# Patient Record
Sex: Male | Born: 1955 | Race: Black or African American | Hispanic: No | Smoking: Current some day smoker
Health system: Southern US, Community
[De-identification: ages and names within clinical notes are randomized; demographics above are authoritative.]

## PROBLEM LIST (undated history)

## (undated) DIAGNOSIS — T884XXA Failed or difficult intubation, initial encounter: Secondary | ICD-10-CM

## (undated) DIAGNOSIS — I10 Essential (primary) hypertension: Secondary | ICD-10-CM

## (undated) DIAGNOSIS — Z923 Personal history of irradiation: Secondary | ICD-10-CM

## (undated) DIAGNOSIS — E079 Disorder of thyroid, unspecified: Secondary | ICD-10-CM

## (undated) DIAGNOSIS — C321 Malignant neoplasm of supraglottis: Secondary | ICD-10-CM

## (undated) DIAGNOSIS — J069 Acute upper respiratory infection, unspecified: Secondary | ICD-10-CM

## (undated) DIAGNOSIS — K219 Gastro-esophageal reflux disease without esophagitis: Secondary | ICD-10-CM

## (undated) DIAGNOSIS — E785 Hyperlipidemia, unspecified: Secondary | ICD-10-CM

## (undated) DIAGNOSIS — D649 Anemia, unspecified: Secondary | ICD-10-CM

## (undated) HISTORY — PX: GASTROSTOMY W/ FEEDING TUBE: SUR642

## (undated) HISTORY — PX: MULTIPLE TOOTH EXTRACTIONS: SHX2053

## (undated) HISTORY — DX: Hyperlipidemia, unspecified: E78.5

## (undated) HISTORY — DX: Disorder of thyroid, unspecified: E07.9

## (undated) HISTORY — PX: LEG SURGERY: SHX1003

## (undated) HISTORY — DX: Anemia, unspecified: D64.9

## (undated) HISTORY — DX: Failed or difficult intubation, initial encounter: T88.4XXA

---
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
## 1998-06-30 ENCOUNTER — Emergency Department (HOSPITAL_COMMUNITY): Admission: EM | Admit: 1998-06-30 | Discharge: 1998-06-30 | Payer: Self-pay | Admitting: Internal Medicine

## 1998-08-03 ENCOUNTER — Emergency Department (HOSPITAL_COMMUNITY): Admission: EM | Admit: 1998-08-03 | Discharge: 1998-08-04 | Payer: Self-pay | Admitting: Emergency Medicine

## 1998-08-10 ENCOUNTER — Emergency Department (HOSPITAL_COMMUNITY): Admission: EM | Admit: 1998-08-10 | Discharge: 1998-08-10 | Payer: Self-pay | Admitting: Emergency Medicine

## 1998-08-19 ENCOUNTER — Emergency Department (HOSPITAL_COMMUNITY): Admission: EM | Admit: 1998-08-19 | Discharge: 1998-08-19 | Payer: Self-pay | Admitting: Emergency Medicine

## 2000-04-10 ENCOUNTER — Encounter: Payer: Self-pay | Admitting: Emergency Medicine

## 2000-04-10 ENCOUNTER — Emergency Department (HOSPITAL_COMMUNITY): Admission: EM | Admit: 2000-04-10 | Discharge: 2000-04-10 | Payer: Self-pay | Admitting: Emergency Medicine

## 2001-05-13 ENCOUNTER — Emergency Department (HOSPITAL_COMMUNITY): Admission: EM | Admit: 2001-05-13 | Discharge: 2001-05-13 | Payer: Self-pay | Admitting: Emergency Medicine

## 2001-05-20 ENCOUNTER — Emergency Department (HOSPITAL_COMMUNITY): Admission: EM | Admit: 2001-05-20 | Discharge: 2001-05-20 | Payer: Self-pay | Admitting: Emergency Medicine

## 2002-09-19 ENCOUNTER — Emergency Department (HOSPITAL_COMMUNITY): Admission: EM | Admit: 2002-09-19 | Discharge: 2002-09-20 | Payer: Self-pay | Admitting: Emergency Medicine

## 2002-09-19 ENCOUNTER — Encounter: Payer: Self-pay | Admitting: Emergency Medicine

## 2002-10-03 ENCOUNTER — Emergency Department (HOSPITAL_COMMUNITY): Admission: EM | Admit: 2002-10-03 | Discharge: 2002-10-03 | Payer: Self-pay | Admitting: *Deleted

## 2004-08-09 ENCOUNTER — Emergency Department (HOSPITAL_COMMUNITY): Admission: EM | Admit: 2004-08-09 | Discharge: 2004-08-09 | Payer: Self-pay | Admitting: Emergency Medicine

## 2007-03-17 ENCOUNTER — Ambulatory Visit: Payer: Self-pay | Admitting: Internal Medicine

## 2007-03-18 ENCOUNTER — Ambulatory Visit: Payer: Self-pay | Admitting: *Deleted

## 2008-02-12 ENCOUNTER — Ambulatory Visit (HOSPITAL_COMMUNITY): Admission: EM | Admit: 2008-02-12 | Discharge: 2008-02-12 | Payer: Self-pay | Admitting: Emergency Medicine

## 2008-08-12 ENCOUNTER — Encounter (INDEPENDENT_AMBULATORY_CARE_PROVIDER_SITE_OTHER): Payer: Self-pay | Admitting: Internal Medicine

## 2009-12-16 DIAGNOSIS — Z9221 Personal history of antineoplastic chemotherapy: Secondary | ICD-10-CM

## 2009-12-16 HISTORY — DX: Personal history of antineoplastic chemotherapy: Z92.21

## 2010-09-15 DIAGNOSIS — C321 Malignant neoplasm of supraglottis: Secondary | ICD-10-CM

## 2010-09-15 HISTORY — DX: Malignant neoplasm of supraglottis: C32.1

## 2010-09-25 ENCOUNTER — Inpatient Hospital Stay (HOSPITAL_COMMUNITY): Admission: EM | Admit: 2010-09-25 | Discharge: 2010-09-26 | Payer: Self-pay | Admitting: Emergency Medicine

## 2010-10-09 ENCOUNTER — Ambulatory Visit: Payer: Self-pay | Admitting: Internal Medicine

## 2010-10-12 ENCOUNTER — Encounter (INDEPENDENT_AMBULATORY_CARE_PROVIDER_SITE_OTHER): Payer: Self-pay | Admitting: Internal Medicine

## 2010-10-16 ENCOUNTER — Ambulatory Visit: Payer: Self-pay | Admitting: Oncology

## 2010-10-16 ENCOUNTER — Encounter: Admission: AD | Admit: 2010-10-16 | Discharge: 2010-10-16 | Payer: Self-pay | Admitting: Dentistry

## 2010-10-16 ENCOUNTER — Ambulatory Visit: Payer: Self-pay | Admitting: Dentistry

## 2010-10-18 ENCOUNTER — Encounter (INDEPENDENT_AMBULATORY_CARE_PROVIDER_SITE_OTHER): Payer: Self-pay | Admitting: Internal Medicine

## 2010-10-19 ENCOUNTER — Ambulatory Visit (HOSPITAL_COMMUNITY): Admission: RE | Admit: 2010-10-19 | Discharge: 2010-10-19 | Payer: Self-pay | Admitting: Otolaryngology

## 2010-10-23 ENCOUNTER — Ambulatory Visit
Admission: RE | Admit: 2010-10-23 | Discharge: 2011-01-08 | Payer: Self-pay | Source: Home / Self Care | Attending: Radiation Oncology | Admitting: Radiation Oncology

## 2010-10-25 ENCOUNTER — Encounter (INDEPENDENT_AMBULATORY_CARE_PROVIDER_SITE_OTHER): Payer: Self-pay | Admitting: Internal Medicine

## 2010-10-25 LAB — CBC WITH DIFFERENTIAL/PLATELET
BASO%: 0.6 % (ref 0.0–2.0)
EOS%: 0.5 % (ref 0.0–7.0)
HCT: 37.8 % — ABNORMAL LOW (ref 38.4–49.9)
MCHC: 33.7 g/dL (ref 32.0–36.0)
MONO#: 0.4 10*3/uL (ref 0.1–0.9)
NEUT%: 74.1 % (ref 39.0–75.0)
RDW: 15 % — ABNORMAL HIGH (ref 11.0–14.6)
WBC: 10.2 10*3/uL (ref 4.0–10.3)
lymph#: 2.2 10*3/uL (ref 0.9–3.3)

## 2010-10-25 LAB — COMPREHENSIVE METABOLIC PANEL
ALT: 19 U/L (ref 0–53)
AST: 22 U/L (ref 0–37)
Albumin: 4.2 g/dL (ref 3.5–5.2)
CO2: 29 mEq/L (ref 19–32)
Calcium: 9.9 mg/dL (ref 8.4–10.5)
Chloride: 102 mEq/L (ref 96–112)
Creatinine, Ser: 1.01 mg/dL (ref 0.40–1.50)
Potassium: 4.3 mEq/L (ref 3.5–5.3)
Sodium: 141 mEq/L (ref 135–145)
Total Protein: 8.4 g/dL — ABNORMAL HIGH (ref 6.0–8.3)

## 2010-10-26 ENCOUNTER — Ambulatory Visit (HOSPITAL_COMMUNITY): Admission: RE | Admit: 2010-10-26 | Discharge: 2010-10-26 | Payer: Self-pay | Admitting: Otolaryngology

## 2010-11-07 ENCOUNTER — Encounter (INDEPENDENT_AMBULATORY_CARE_PROVIDER_SITE_OTHER): Payer: Self-pay | Admitting: Internal Medicine

## 2010-11-12 LAB — PROTEIN ELECTROPHORESIS, SERUM
Albumin ELP: 54.6 % — ABNORMAL LOW (ref 55.8–66.1)
Alpha-1-Globulin: 5.5 % — ABNORMAL HIGH (ref 2.9–4.9)
Alpha-2-Globulin: 12 % — ABNORMAL HIGH (ref 7.1–11.8)
Beta 2: 7.2 % — ABNORMAL HIGH (ref 3.2–6.5)
Beta Globulin: 5.8 % (ref 4.7–7.2)
Gamma Globulin: 14.9 % (ref 11.1–18.8)
Total Protein, Serum Electrophoresis: 7.7 g/dL (ref 6.0–8.3)

## 2010-11-12 LAB — KAPPA/LAMBDA LIGHT CHAINS: Lambda Free Lght Chn: 0.68 mg/dL (ref 0.57–2.63)

## 2010-11-14 ENCOUNTER — Ambulatory Visit: Payer: Self-pay | Admitting: Oncology

## 2010-11-15 ENCOUNTER — Encounter (INDEPENDENT_AMBULATORY_CARE_PROVIDER_SITE_OTHER): Payer: Self-pay | Admitting: Internal Medicine

## 2010-11-16 ENCOUNTER — Encounter (INDEPENDENT_AMBULATORY_CARE_PROVIDER_SITE_OTHER): Payer: Self-pay | Admitting: Internal Medicine

## 2010-11-16 LAB — CBC WITH DIFFERENTIAL/PLATELET
BASO%: 0.4 % (ref 0.0–2.0)
Basophils Absolute: 0 10*3/uL (ref 0.0–0.1)
EOS%: 1 % (ref 0.0–7.0)
Eosinophils Absolute: 0.1 10*3/uL (ref 0.0–0.5)
HCT: 35.4 % — ABNORMAL LOW (ref 38.4–49.9)
HGB: 11.8 g/dL — ABNORMAL LOW (ref 13.0–17.1)
LYMPH%: 22.4 % (ref 14.0–49.0)
MCH: 30 pg (ref 27.2–33.4)
MCHC: 33.3 g/dL (ref 32.0–36.0)
MCV: 90.1 fL (ref 79.3–98.0)
MONO#: 0.6 10*3/uL (ref 0.1–0.9)
MONO%: 5.9 % (ref 0.0–14.0)
NEUT#: 6.8 10*3/uL — ABNORMAL HIGH (ref 1.5–6.5)
NEUT%: 70.3 % (ref 39.0–75.0)
Platelets: 349 10*3/uL (ref 140–400)
RBC: 3.93 10*6/uL — ABNORMAL LOW (ref 4.20–5.82)
RDW: 14.1 % (ref 11.0–14.6)
WBC: 9.7 10*3/uL (ref 4.0–10.3)
lymph#: 2.2 10*3/uL (ref 0.9–3.3)
nRBC: 0 % (ref 0–0)

## 2010-11-16 LAB — MAGNESIUM: Magnesium: 1.9 mg/dL (ref 1.5–2.5)

## 2010-11-16 LAB — COMPREHENSIVE METABOLIC PANEL
ALT: 20 U/L (ref 0–53)
AST: 21 U/L (ref 0–37)
Albumin: 4.4 g/dL (ref 3.5–5.2)
Alkaline Phosphatase: 116 U/L (ref 39–117)
BUN: 8 mg/dL (ref 6–23)
CO2: 24 mEq/L (ref 19–32)
Calcium: 9.6 mg/dL (ref 8.4–10.5)
Chloride: 103 mEq/L (ref 96–112)
Creatinine, Ser: 0.76 mg/dL (ref 0.40–1.50)
Glucose, Bld: 95 mg/dL (ref 70–99)
Potassium: 4.3 mEq/L (ref 3.5–5.3)
Sodium: 140 mEq/L (ref 135–145)
Total Bilirubin: 0.3 mg/dL (ref 0.3–1.2)
Total Protein: 7.4 g/dL (ref 6.0–8.3)

## 2010-11-21 LAB — BASIC METABOLIC PANEL
BUN: 13 mg/dL (ref 6–23)
CO2: 29 mEq/L (ref 19–32)
Calcium: 9.7 mg/dL (ref 8.4–10.5)
Chloride: 98 mEq/L (ref 96–112)
Creatinine, Ser: 1.01 mg/dL (ref 0.40–1.50)
Glucose, Bld: 123 mg/dL — ABNORMAL HIGH (ref 70–99)
Potassium: 3.8 mEq/L (ref 3.5–5.3)
Sodium: 138 mEq/L (ref 135–145)

## 2010-11-21 LAB — MAGNESIUM: Magnesium: 1.7 mg/dL (ref 1.5–2.5)

## 2010-11-22 ENCOUNTER — Encounter (INDEPENDENT_AMBULATORY_CARE_PROVIDER_SITE_OTHER): Payer: Self-pay | Admitting: Internal Medicine

## 2010-11-28 LAB — BASIC METABOLIC PANEL
BUN: 6 mg/dL (ref 6–23)
CO2: 30 mEq/L (ref 19–32)
Calcium: 9 mg/dL (ref 8.4–10.5)
Chloride: 104 mEq/L (ref 96–112)
Creatinine, Ser: 0.84 mg/dL (ref 0.40–1.50)
Glucose, Bld: 104 mg/dL — ABNORMAL HIGH (ref 70–99)
Potassium: 4.1 mEq/L (ref 3.5–5.3)
Sodium: 140 mEq/L (ref 135–145)

## 2010-11-28 LAB — MAGNESIUM: Magnesium: 1.7 mg/dL (ref 1.5–2.5)

## 2010-12-04 ENCOUNTER — Encounter (INDEPENDENT_AMBULATORY_CARE_PROVIDER_SITE_OTHER): Payer: Self-pay | Admitting: Internal Medicine

## 2010-12-04 LAB — COMPREHENSIVE METABOLIC PANEL
ALT: 12 U/L (ref 0–53)
AST: 15 U/L (ref 0–37)
Albumin: 4.5 g/dL (ref 3.5–5.2)
Alkaline Phosphatase: 96 U/L (ref 39–117)
BUN: 10 mg/dL (ref 6–23)
CO2: 27 mEq/L (ref 19–32)
Calcium: 9.7 mg/dL (ref 8.4–10.5)
Chloride: 102 mEq/L (ref 96–112)
Creatinine, Ser: 0.93 mg/dL (ref 0.40–1.50)
Glucose, Bld: 115 mg/dL — ABNORMAL HIGH (ref 70–99)
Potassium: 4.2 mEq/L (ref 3.5–5.3)
Sodium: 140 mEq/L (ref 135–145)
Total Bilirubin: 0.4 mg/dL (ref 0.3–1.2)
Total Protein: 7.2 g/dL (ref 6.0–8.3)

## 2010-12-04 LAB — CBC WITH DIFFERENTIAL/PLATELET
BASO%: 0.5 % (ref 0.0–2.0)
Basophils Absolute: 0 10*3/uL (ref 0.0–0.1)
EOS%: 1.3 % (ref 0.0–7.0)
Eosinophils Absolute: 0 10*3/uL (ref 0.0–0.5)
HCT: 32.4 % — ABNORMAL LOW (ref 38.4–49.9)
HGB: 11.2 g/dL — ABNORMAL LOW (ref 13.0–17.1)
LYMPH%: 22.6 % (ref 14.0–49.0)
MCH: 31.5 pg (ref 27.2–33.4)
MCHC: 34.5 g/dL (ref 32.0–36.0)
MCV: 91.3 fL (ref 79.3–98.0)
MONO#: 0.2 10*3/uL (ref 0.1–0.9)
MONO%: 9.6 % (ref 0.0–14.0)
NEUT#: 1.4 10*3/uL — ABNORMAL LOW (ref 1.5–6.5)
NEUT%: 66 % (ref 39.0–75.0)
Platelets: 281 10*3/uL (ref 140–400)
RBC: 3.55 10*6/uL — ABNORMAL LOW (ref 4.20–5.82)
RDW: 16.2 % — ABNORMAL HIGH (ref 11.0–14.6)
WBC: 2.1 10*3/uL — ABNORMAL LOW (ref 4.0–10.3)
lymph#: 0.5 10*3/uL — ABNORMAL LOW (ref 0.9–3.3)

## 2010-12-04 LAB — MAGNESIUM: Magnesium: 1.9 mg/dL (ref 1.5–2.5)

## 2010-12-06 ENCOUNTER — Ambulatory Visit: Payer: Self-pay | Admitting: Oncology

## 2010-12-13 ENCOUNTER — Encounter (INDEPENDENT_AMBULATORY_CARE_PROVIDER_SITE_OTHER): Payer: Self-pay | Admitting: Internal Medicine

## 2010-12-13 LAB — BASIC METABOLIC PANEL
BUN: 24 mg/dL — ABNORMAL HIGH (ref 6–23)
CO2: 31 mEq/L (ref 19–32)
Calcium: 9.5 mg/dL (ref 8.4–10.5)
Chloride: 99 mEq/L (ref 96–112)
Creatinine, Ser: 1.72 mg/dL — ABNORMAL HIGH (ref 0.40–1.50)
Glucose, Bld: 104 mg/dL — ABNORMAL HIGH (ref 70–99)
Potassium: 3.6 mEq/L (ref 3.5–5.3)
Sodium: 140 mEq/L (ref 135–145)

## 2010-12-13 LAB — MAGNESIUM: Magnesium: 1.9 mg/dL (ref 1.5–2.5)

## 2010-12-19 ENCOUNTER — Ambulatory Visit (HOSPITAL_COMMUNITY)
Admission: RE | Admit: 2010-12-19 | Discharge: 2010-12-19 | Payer: Self-pay | Source: Home / Self Care | Attending: Oncology | Admitting: Oncology

## 2010-12-19 LAB — BASIC METABOLIC PANEL
BUN: 17 mg/dL (ref 6–23)
CO2: 28 mEq/L (ref 19–32)
Calcium: 9.3 mg/dL (ref 8.4–10.5)
Chloride: 101 mEq/L (ref 96–112)
Creatinine, Ser: 1.32 mg/dL (ref 0.4–1.5)
GFR calc Af Amer: >60 mL/min (ref >60)
GFR calc non Af Amer: 56 mL/min — ABNORMAL LOW (ref >60)
Glucose, Bld: 93 mg/dL (ref 70–99)
Potassium: 3.5 mEq/L (ref 3.5–5.1)
Sodium: 139 mEq/L (ref 135–145)

## 2010-12-19 LAB — PROTIME-INR
INR: 1.05 (ref 0.00–1.49)
Prothrombin Time: 13.9 seconds (ref 11.6–15.2)

## 2010-12-19 LAB — CBC
HCT: 29.5 % — ABNORMAL LOW (ref 39.0–52.0)
Hemoglobin: 9.9 g/dL — ABNORMAL LOW (ref 13.0–17.0)
MCH: 30.2 pg (ref 26.0–34.0)
MCHC: 33.6 g/dL (ref 30.0–36.0)
MCV: 89.9 fL (ref 78.0–100.0)
Platelets: 188 10*3/uL (ref 150–400)
RBC: 3.28 MIL/uL — ABNORMAL LOW (ref 4.22–5.81)
RDW: 15.1 % (ref 11.5–15.5)
WBC: 2.9 10*3/uL — ABNORMAL LOW (ref 4.0–10.5)

## 2010-12-19 LAB — APTT: aPTT: 29 seconds (ref 24–37)

## 2010-12-19 LAB — MAGNESIUM: Magnesium: 1.8 mg/dL (ref 1.5–2.5)

## 2010-12-20 ENCOUNTER — Ambulatory Visit (HOSPITAL_BASED_OUTPATIENT_CLINIC_OR_DEPARTMENT_OTHER): Payer: Self-pay | Admitting: Oncology

## 2010-12-20 ENCOUNTER — Encounter (INDEPENDENT_AMBULATORY_CARE_PROVIDER_SITE_OTHER): Payer: Self-pay | Admitting: Internal Medicine

## 2010-12-26 LAB — CBC WITH DIFFERENTIAL/PLATELET
BASO%: 0 % (ref 0.0–2.0)
Basophils Absolute: 0 10*3/uL (ref 0.0–0.1)
EOS%: 1 % (ref 0.0–7.0)
Eosinophils Absolute: 0 10*3/uL (ref 0.0–0.5)
HCT: 27.4 % — ABNORMAL LOW (ref 38.4–49.9)
HGB: 9.4 g/dL — ABNORMAL LOW (ref 13.0–17.1)
LYMPH%: 26.9 % (ref 14.0–49.0)
MCH: 30.1 pg (ref 27.2–33.4)
MCHC: 34.3 g/dL (ref 32.0–36.0)
MCV: 87.8 fL (ref 79.3–98.0)
MONO#: 0.2 10*3/uL (ref 0.1–0.9)
MONO%: 20.2 % — ABNORMAL HIGH (ref 0.0–14.0)
NEUT#: 0.5 10*3/uL — ABNORMAL LOW (ref 1.5–6.5)
NEUT%: 51.9 % (ref 39.0–75.0)
Platelets: 151 10*3/uL (ref 140–400)
RBC: 3.12 10*6/uL — ABNORMAL LOW (ref 4.20–5.82)
RDW: 16 % — ABNORMAL HIGH (ref 11.0–14.6)
WBC: 1 10*3/uL — ABNORMAL LOW (ref 4.0–10.3)
lymph#: 0.3 10*3/uL — ABNORMAL LOW (ref 0.9–3.3)

## 2010-12-26 LAB — COMPREHENSIVE METABOLIC PANEL
ALT: <8 U/L (ref 0–53)
AST: 13 U/L (ref 0–37)
Albumin: 4.2 g/dL (ref 3.5–5.2)
Alkaline Phosphatase: 73 U/L (ref 39–117)
BUN: 14 mg/dL (ref 6–23)
CO2: 30 mEq/L (ref 19–32)
Calcium: 9.2 mg/dL (ref 8.4–10.5)
Chloride: 101 mEq/L (ref 96–112)
Creatinine, Ser: 1.04 mg/dL (ref 0.40–1.50)
Glucose, Bld: 88 mg/dL (ref 70–99)
Potassium: 4.1 mEq/L (ref 3.5–5.3)
Sodium: 140 mEq/L (ref 135–145)
Total Bilirubin: 0.2 mg/dL — ABNORMAL LOW (ref 0.3–1.2)
Total Protein: 6.7 g/dL (ref 6.0–8.3)

## 2010-12-26 LAB — MAGNESIUM: Magnesium: 1.7 mg/dL (ref 1.5–2.5)

## 2011-01-05 ENCOUNTER — Other Ambulatory Visit: Payer: Self-pay | Admitting: Oncology

## 2011-01-05 DIAGNOSIS — C329 Malignant neoplasm of larynx, unspecified: Secondary | ICD-10-CM

## 2011-01-06 ENCOUNTER — Encounter: Payer: Self-pay | Admitting: Internal Medicine

## 2011-01-16 ENCOUNTER — Ambulatory Visit: Payer: Self-pay | Admitting: Radiation Oncology

## 2011-01-17 ENCOUNTER — Ambulatory Visit (HOSPITAL_BASED_OUTPATIENT_CLINIC_OR_DEPARTMENT_OTHER): Payer: Self-pay | Admitting: Oncology

## 2011-01-17 DIAGNOSIS — E860 Dehydration: Secondary | ICD-10-CM

## 2011-01-17 DIAGNOSIS — C321 Malignant neoplasm of supraglottis: Secondary | ICD-10-CM

## 2011-01-17 DIAGNOSIS — E46 Unspecified protein-calorie malnutrition: Secondary | ICD-10-CM

## 2011-01-17 LAB — CBC WITH DIFFERENTIAL/PLATELET
BASO%: 0.4 % (ref 0.0–2.0)
Basophils Absolute: 0 10*3/uL (ref 0.0–0.1)
EOS%: 0.4 % (ref 0.0–7.0)
Eosinophils Absolute: 0 10*3/uL (ref 0.0–0.5)
HCT: 28.7 % — ABNORMAL LOW (ref 38.4–49.9)
HGB: 9.8 g/dL — ABNORMAL LOW (ref 13.0–17.1)
LYMPH%: 8.7 % — ABNORMAL LOW (ref 14.0–49.0)
MCH: 32 pg (ref 27.2–33.4)
MCHC: 34.2 g/dL (ref 32.0–36.0)
MCV: 93.4 fL (ref 79.3–98.0)
MONO#: 0.5 10*3/uL (ref 0.1–0.9)
MONO%: 15.4 % — ABNORMAL HIGH (ref 0.0–14.0)
NEUT#: 2.5 10*3/uL (ref 1.5–6.5)
NEUT%: 75.1 % — ABNORMAL HIGH (ref 39.0–75.0)
Platelets: 322 10*3/uL (ref 140–400)
RBC: 3.08 10*6/uL — ABNORMAL LOW (ref 4.20–5.82)
RDW: 19.7 % — ABNORMAL HIGH (ref 11.0–14.6)
WBC: 3.3 10*3/uL — ABNORMAL LOW (ref 4.0–10.3)
lymph#: 0.3 10*3/uL — ABNORMAL LOW (ref 0.9–3.3)

## 2011-01-17 LAB — COMPREHENSIVE METABOLIC PANEL
ALT: 11 U/L (ref 0–53)
AST: 17 U/L (ref 0–37)
Albumin: 4.7 g/dL (ref 3.5–5.2)
Alkaline Phosphatase: 93 U/L (ref 39–117)
BUN: 21 mg/dL (ref 6–23)
CO2: 27 mEq/L (ref 19–32)
Calcium: 10.1 mg/dL (ref 8.4–10.5)
Chloride: 99 mEq/L (ref 96–112)
Creatinine, Ser: 1.1 mg/dL (ref 0.40–1.50)
Glucose, Bld: 98 mg/dL (ref 70–99)
Potassium: 4.2 mEq/L (ref 3.5–5.3)
Sodium: 139 mEq/L (ref 135–145)
Total Bilirubin: 0.3 mg/dL (ref 0.3–1.2)
Total Protein: 7.4 g/dL (ref 6.0–8.3)

## 2011-01-17 LAB — MAGNESIUM: Magnesium: 2.1 mg/dL (ref 1.5–2.5)

## 2011-01-17 NOTE — Letter (Signed)
Summary: [facility]  [facility]   Imported By: Arta Bruce 10/31/2010 14:53:08  _____________________________________________________________________  External Attachment:    Type:   Image     Comment:   External Document

## 2011-01-17 NOTE — Letter (Signed)
Summary: [facility]//NEW PT EVAL  [facility]//NEW PT EVAL   Imported By: Arta Bruce 11/19/2010 14:54:36  _____________________________________________________________________  External Attachment:    Type:   Image     Comment:   External Document

## 2011-01-17 NOTE — Letter (Signed)
Summary: [facility]HEMATOLOGY/ONCOLOGY/NEW PT  [facility]HEMATOLOGY/ONCOLOGY/NEW PT   Imported By: Arta Bruce 11/27/2010 15:16:05  _____________________________________________________________________  External Attachment:    Type:   Image     Comment:   External Document

## 2011-01-17 NOTE — Letter (Signed)
Summary: [facility]  [facility]   Imported By: Arta Bruce 11/27/2010 15:21:57  _____________________________________________________________________  External Attachment:    Type:   Image     Comment:   External Document

## 2011-01-17 NOTE — Miscellaneous (Signed)
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 Lazarus Salines T2N2cM0 PET Scan, CT scan 10/19/10 with no evidence of metastatic disease, but bilateral cervical node hypermetabolic activitiy Planning for teeth extraction and biopsy 11/11

## 2011-01-17 NOTE — Assessment & Plan Note (Signed)
Summary: 1XFU/SUPRAGROTTIC MASS//KT   Vital Signs:  Patient profile:   55 year old male Weight:      155 pounds Temp:     97.7 degrees F oral Pulse rate:   93 / minute Pulse rhythm:   regular Resp:     18 per minute BP sitting:   140 / 82  (left arm) Cuff size:   regular  Vitals Entered By: Michelle Nasuti (October 09, 2010 3:03 PM) CC: c/o throat pain. having difficulty swallowing Is Patient Diabetic? No Pain Assessment Patient in pain? yes     Location: throat Intensity: 5 Type: burning  Does patient need assistance? Functional Status Self care Ambulation Normal   CC:  c/o throat pain. having difficulty swallowing.  History of Present Illness: 55 yo male here to establish.  Concerns:  1.  Supraglottic mass concerning for squamous cell carcinoma.  Pt. admitted overnight on 10/11 after findings on CT showing mass.  Pt. presented with history at time of dysphagia for 3 weeks.  Has had tenderness in right anterior cervical area for same period of time.  Has not noted any weight loss.  Has had a cough for 2 weeks and intermittently coughs up blood.  Has also had melenontic stools following the coughin up of blood.    Started smoking age 61.  Prior to hospitalization, smoked 1ppd.  Still smoking 3-4 cigarettes daily.  Pt. has appt. with Dr. Lazarus Salines this Friday.  Plans for outpatient visit followed by hopefully, biopsy.    2.  Preventive:  has never had a flu or pneumonia vaccine previously.  Habits & Providers  Alcohol-Tobacco-Diet     Tobacco Status: current     Cigarette Packs/Day: 0.5  Current Medications (verified): 1)  None  Allergies (verified): No Known Drug Allergies  Family History: Mother, 21:  Alzheimer's Dementia Father--did not know him well--has died. Brother, died 52:  complications following burns suffered in a housefire 2 Sisters:  Healthy Son, 75:  Not aware of any health issues Son, 20:  Not aware of any health issues.  Social  History: Separated Has been a Location manager, [employer] representative, feed mill work Lives alone Tobacco:  smoked since age 65, currently 4 cigarettes daily Alcohol:  Hx of abuse.  no alcohol since July 4. 2011 Drug:  none. Incarcerated:  for drunk driving in the past.Smoking Status:  current Packs/Day:  0.5  Physical Exam  General:  NAD Mouth:  pharynx pink and moist, poor dentition, and teeth missing.   Neck:  Tender lymph nodes vs mass in right anterior cervical area.  No suprclavicular swelling or mass. Lungs:  Normal respiratory effort, chest expands symmetrically. Lungs are clear to auscultation, no crackles or wheezes. Heart:  Normal rate and regular rhythm. S1 and S2 normal without gallop, murmur, click, rub or other extra sounds.  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 Encouraged pt. to keep this appt. with Dr. Emi Belfast missed a previous appt.  Other Orders: Influenza Vaccine NON MCR (84696) Pneumococcal Vaccine (29528) Admin 1st Vaccine (41324)  Patient Instructions: 1)  Please make an eligibility appt. 2)  Please call when you get eligibility and make an appt. with Dr. Delrae Alfred   Orders Added: 1)  New Patient Level II [99202] 2)  Influenza Vaccine NON MCR [00028] 3)  Pneumococcal Vaccine [90732] 4)  Admin 1st Vaccine [40102]   Immunizations  Administered:  Influenza Vaccine # 1:    Vaccine Type: Fluvax Non-MCR    Site: right deltoid    Mfr: GlaxoSmithKline    Dose: 0.5 ml    Route: IM    Given by: Michelle Nasuti    Exp. Date: 06/15/2011    Lot #: [id number]    VIS given: 07/10/10 version given October 09, 2010.  Pneumonia Vaccine:    Vaccine Type: Pneumovax    Site: right deltoid    Mfr: Merck    Dose: 0.5 ml    Route: IM    Given by: Michelle Nasuti    Exp. Date: 03/03/2012    Lot #:  [id number]    VIS given: 11/20/09 version given October 09, 2010.  Flu Vaccine Consent Questions:    Do you have a history of severe allergic reactions to this vaccine? no    Any prior history of allergic reactions to egg and/or gelatin? no    Do you have a sensitivity to the preservative Thimersol? no    Do you have a past history of Guillan-Barre Syndrome? no    Do you currently have an acute febrile illness? no    Have you ever had a severe reaction to latex? no    Vaccine information given and explained to patient? yes   Immunizations Administered:  Influenza Vaccine # 1:    Vaccine Type: Fluvax Non-MCR    Site: right deltoid    Mfr: GlaxoSmithKline    Dose: 0.5 ml    Route: IM    Given by: Michelle Nasuti    Exp. Date: 06/15/2011    Lot #: [id number]    VIS given: 07/10/10 version given October 09, 2010.  Pneumonia Vaccine:    Vaccine Type: Pneumovax    Site: right deltoid    Mfr: Merck    Dose: 0.5 ml    Route: IM    Given by: Michelle Nasuti    Exp. Date: 03/03/2012    Lot #: [id number]    VIS given: 11/20/09 version given October 09, 2010.

## 2011-01-17 NOTE — Letter (Signed)
Summary: PT INFORMATION SHEET  PT INFORMATION SHEET   Imported By: Arta Bruce 10/12/2010 11:22:50  _____________________________________________________________________  External Attachment:    Type:   Image     Comment:   External Document

## 2011-01-17 NOTE — Letter (Signed)
Summary: [facility]  [facility]   Imported By: Arta Bruce 10/17/2010 10:08:58  _____________________________________________________________________  External Attachment:    Type:   Image     Comment:   External Document

## 2011-01-23 NOTE — Letter (Signed)
Summary: HEMATOLOGY/MEDICAL ONCOLOGY  HEMATOLOGY/MEDICAL ONCOLOGY   Imported By: Arta Bruce 01/18/2011 13:59:11  _____________________________________________________________________  External Attachment:    Type:   Image     Comment:   External Document

## 2011-01-23 NOTE — Letter (Signed)
Summary: hematology/medical oncology  hematology/medical oncology   Imported By: Arta Bruce 01/18/2011 14:35:34  _____________________________________________________________________  External Attachment:    Type:   Image     Comment:   External Document

## 2011-01-25 ENCOUNTER — Encounter (INDEPENDENT_AMBULATORY_CARE_PROVIDER_SITE_OTHER): Payer: Self-pay | Admitting: Internal Medicine

## 2011-01-31 NOTE — Letter (Signed)
Summary: HEMATOLOGY/MEDICAL ONCOLOGY  HEMATOLOGY/MEDICAL ONCOLOGY   Imported By: Arta Bruce 01/21/2011 12:11:03  _____________________________________________________________________  External Attachment:    Type:   Image     Comment:   External Document

## 2011-02-06 NOTE — Letter (Signed)
Summary: HEMATOLOGY/MEDICAL ONCOLOGY  HEMATOLOGY/MEDICAL ONCOLOGY   Imported By: Arta Bruce 01/31/2011 10:14:35  _____________________________________________________________________  External Attachment:    Type:   Image     Comment:   External Document

## 2011-02-07 ENCOUNTER — Ambulatory Visit: Payer: Self-pay | Attending: Radiation Oncology | Admitting: Radiation Oncology

## 2011-02-21 NOTE — Letter (Signed)
Summary: HEMATOLOGY/MEDICAL ONCOLOGY  HEMATOLOGY/MEDICAL ONCOLOGY   Imported By: Arta Bruce 02/13/2011 11:46:28  _____________________________________________________________________  External Attachment:    Type:   Image     Comment:   External Document

## 2011-02-26 LAB — GLUCOSE, CAPILLARY: Glucose-Capillary: 103 mg/dL — ABNORMAL HIGH (ref 70–99)

## 2011-02-26 LAB — CBC
MCV: 93.2 fL (ref 78.0–100.0)
Platelets: 426 10*3/uL — ABNORMAL HIGH (ref 150–400)
RBC: 4.13 MIL/uL — ABNORMAL LOW (ref 4.22–5.81)
RDW: 14.4 % (ref 11.5–15.5)
WBC: 11.7 10*3/uL — ABNORMAL HIGH (ref 4.0–10.5)

## 2011-02-26 LAB — HEPATIC FUNCTION PANEL
ALT: 20 U/L (ref 0–53)
AST: 20 U/L (ref 0–37)
Bilirubin, Direct: <0.1 mg/dL (ref 0.0–0.3)
Total Protein: 8.1 g/dL (ref 6.0–8.3)

## 2011-02-26 LAB — BASIC METABOLIC PANEL
BUN: 6 mg/dL (ref 6–23)
Calcium: 10.6 mg/dL — ABNORMAL HIGH (ref 8.4–10.5)
Creatinine, Ser: 0.91 mg/dL (ref 0.4–1.5)
GFR calc Af Amer: >60 mL/min (ref >60)

## 2011-02-26 LAB — SURGICAL PCR SCREEN: MRSA, PCR: NEGATIVE

## 2011-02-26 NOTE — Letter (Signed)
Summary: [facility]//END OF TREATMENT  [facility]//END OF TREATMENT   Imported By: Arta Bruce 02/18/2011 08:40:02  _____________________________________________________________________  External Attachment:    Type:   Image     Comment:   External Document

## 2011-02-27 LAB — CBC
Hemoglobin: 14.4 g/dL (ref 13.0–17.0)
MCV: 93.3 fL (ref 78.0–100.0)
Platelets: 405 10*3/uL — ABNORMAL HIGH (ref 150–400)
RBC: 4.45 MIL/uL (ref 4.22–5.81)
WBC: 9.8 10*3/uL (ref 4.0–10.5)

## 2011-02-27 LAB — DIFFERENTIAL
Basophils Absolute: 0 10*3/uL (ref 0.0–0.1)
Basophils Relative: 0 % (ref 0–1)
Eosinophils Absolute: 0 10*3/uL (ref 0.0–0.7)
Eosinophils Relative: 0 % (ref 0–5)
Monocytes Absolute: 0.4 10*3/uL (ref 0.1–1.0)

## 2011-02-27 LAB — COMPREHENSIVE METABOLIC PANEL
AST: 30 U/L (ref 0–37)
Albumin: 4.4 g/dL (ref 3.5–5.2)
Alkaline Phosphatase: 117 U/L (ref 39–117)
CO2: 28 mEq/L (ref 19–32)
Chloride: 102 mEq/L (ref 96–112)
GFR calc Af Amer: >60 mL/min (ref >60)
GFR calc non Af Amer: >60 mL/min (ref >60)
Potassium: 3.5 mEq/L (ref 3.5–5.1)
Total Bilirubin: 0.8 mg/dL (ref 0.3–1.2)

## 2011-02-28 ENCOUNTER — Other Ambulatory Visit: Payer: Self-pay | Admitting: Oncology

## 2011-02-28 ENCOUNTER — Encounter (HOSPITAL_BASED_OUTPATIENT_CLINIC_OR_DEPARTMENT_OTHER): Payer: Medicaid Other | Admitting: Oncology

## 2011-02-28 DIAGNOSIS — E860 Dehydration: Secondary | ICD-10-CM

## 2011-02-28 DIAGNOSIS — E46 Unspecified protein-calorie malnutrition: Secondary | ICD-10-CM

## 2011-02-28 DIAGNOSIS — C321 Malignant neoplasm of supraglottis: Secondary | ICD-10-CM

## 2011-02-28 LAB — CBC WITH DIFFERENTIAL/PLATELET
EOS%: 1.3 % (ref 0.0–7.0)
Eosinophils Absolute: 0.1 10*3/uL (ref 0.0–0.5)
LYMPH%: 9.3 % — ABNORMAL LOW (ref 14.0–49.0)
MCH: 32.8 pg (ref 27.2–33.4)
MCHC: 34 g/dL (ref 32.0–36.0)
MCV: 96.3 fL (ref 79.3–98.0)
MONO%: 9 % (ref 0.0–14.0)
Platelets: 259 10*3/uL (ref 140–400)
RBC: 3.25 10*6/uL — ABNORMAL LOW (ref 4.20–5.82)

## 2011-02-28 LAB — BASIC METABOLIC PANEL
Calcium: 9.8 mg/dL (ref 8.4–10.5)
Glucose, Bld: 92 mg/dL (ref 70–99)
Sodium: 137 mEq/L (ref 135–145)

## 2011-02-28 LAB — MAGNESIUM: Magnesium: 1.9 mg/dL (ref 1.5–2.5)

## 2011-03-06 ENCOUNTER — Other Ambulatory Visit (HOSPITAL_COMMUNITY): Payer: Self-pay | Admitting: Dentistry

## 2011-03-06 DIAGNOSIS — K117 Disturbances of salivary secretion: Secondary | ICD-10-CM

## 2011-03-06 DIAGNOSIS — K08109 Complete loss of teeth, unspecified cause, unspecified class: Secondary | ICD-10-CM

## 2011-03-06 DIAGNOSIS — K08409 Partial loss of teeth, unspecified cause, unspecified class: Secondary | ICD-10-CM

## 2011-03-06 DIAGNOSIS — R1310 Dysphagia, unspecified: Secondary | ICD-10-CM

## 2011-03-28 ENCOUNTER — Ambulatory Visit: Payer: Medicaid Other | Attending: Radiation Oncology | Admitting: Radiation Oncology

## 2011-04-03 ENCOUNTER — Inpatient Hospital Stay (HOSPITAL_COMMUNITY): Admission: RE | Admit: 2011-04-03 | Payer: Self-pay | Source: Ambulatory Visit

## 2011-04-04 ENCOUNTER — Ambulatory Visit: Payer: Medicaid Other | Attending: Radiation Oncology | Admitting: Radiation Oncology

## 2011-04-08 ENCOUNTER — Encounter (HOSPITAL_COMMUNITY)
Admission: RE | Admit: 2011-04-08 | Discharge: 2011-04-08 | Disposition: A | Discharge status: Home / Self Care | Payer: Medicaid Other | Source: Ambulatory Visit | Attending: Oncology | Admitting: Oncology

## 2011-04-08 ENCOUNTER — Encounter (HOSPITAL_COMMUNITY): Payer: Self-pay

## 2011-04-08 DIAGNOSIS — K8020 Calculus of gallbladder without cholecystitis without obstruction: Secondary | ICD-10-CM | POA: Insufficient documentation

## 2011-04-08 DIAGNOSIS — C329 Malignant neoplasm of larynx, unspecified: Secondary | ICD-10-CM | POA: Insufficient documentation

## 2011-04-08 MED ORDER — FLUDEOXYGLUCOSE F - 18 (FDG) INJECTION
17.2000 | Freq: Once | INTRAVENOUS | Status: AC | PRN
  Administered 2011-04-08: 17.2 via INTRAVENOUS

## 2011-04-11 ENCOUNTER — Encounter: Payer: Self-pay | Admitting: Oncology

## 2011-04-11 ENCOUNTER — Other Ambulatory Visit: Payer: Self-pay | Admitting: Oncology

## 2011-04-11 ENCOUNTER — Encounter (HOSPITAL_BASED_OUTPATIENT_CLINIC_OR_DEPARTMENT_OTHER): Payer: Self-pay | Admitting: Oncology

## 2011-04-11 ENCOUNTER — Ambulatory Visit: Payer: Medicaid Other | Attending: Radiation Oncology | Admitting: Radiation Oncology

## 2011-04-11 DIAGNOSIS — K117 Disturbances of salivary secretion: Secondary | ICD-10-CM

## 2011-04-11 DIAGNOSIS — C321 Malignant neoplasm of supraglottis: Secondary | ICD-10-CM

## 2011-04-11 DIAGNOSIS — C329 Malignant neoplasm of larynx, unspecified: Secondary | ICD-10-CM

## 2011-04-11 DIAGNOSIS — Z931 Gastrostomy status: Secondary | ICD-10-CM

## 2011-04-11 DIAGNOSIS — R933 Abnormal findings on diagnostic imaging of other parts of digestive tract: Secondary | ICD-10-CM

## 2011-04-11 DIAGNOSIS — E46 Unspecified protein-calorie malnutrition: Secondary | ICD-10-CM

## 2011-04-11 DIAGNOSIS — E860 Dehydration: Secondary | ICD-10-CM

## 2011-04-11 LAB — COMPREHENSIVE METABOLIC PANEL
ALT: 17 U/L (ref 0–53)
AST: 27 U/L (ref 0–37)
Albumin: 4.8 g/dL (ref 3.5–5.2)
Alkaline Phosphatase: 94 U/L (ref 39–117)
Potassium: 4.8 mEq/L (ref 3.5–5.3)
Sodium: 137 mEq/L (ref 135–145)
Total Protein: 7.9 g/dL (ref 6.0–8.3)

## 2011-04-11 LAB — CBC WITH DIFFERENTIAL/PLATELET
EOS%: 0.7 % (ref 0.0–7.0)
Eosinophils Absolute: 0.1 10*3/uL (ref 0.0–0.5)
MCH: 33.5 pg — ABNORMAL HIGH (ref 27.2–33.4)
MCV: 97.3 fL (ref 79.3–98.0)
MONO%: 7.7 % (ref 0.0–14.0)
NEUT#: 6.8 10*3/uL — ABNORMAL HIGH (ref 1.5–6.5)
RBC: 3.68 10*6/uL — ABNORMAL LOW (ref 4.20–5.82)
RDW: 13.5 % (ref 11.0–14.6)

## 2011-04-30 NOTE — Op Note (Signed)
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   PROCEDURE:  Exploration of wound right long finger composite graft to  the volar defect of the right long finger.   ANESTHESIA:  General.   COMPLICATIONS:  No acute complications.   ESTIMATED BLOOD LOSS:  5 mL.   SPECIMENS:  The remaining soft tissue of the right long finger including  the nail were sent for gross identification.   INDICATIONS FOR PROCEDURE:  The patient is a pleasant gentleman who  smashed his finger at work today.  Hand surgery was emergently  consulted.  The appearance of the finger and the amputated part was a  degloving type injury at the DIP level.  The skin, soft tissue, and nail  matrix were avulsed from the underlying bony matrix.  The distal part  was crushed and there were what appeared to be stretched nerves dangling  from the distal part.  Risks, benefits, and alternatives of surgery were  discussed with the patient.  The patient is a smoker and the likelihood  of replantation of essentially this avulsed skin and subcutaneous tissue  were discussed with the patient and the very unlikely nature that  replantation would be successful.  The patient agreed to proceed and  agreed that if revision amputation needed to be performed, he was okay  with that.   DESCRIPTION OF PROCEDURE:  The patient was taken to the operating room  and general anesthesia was administered.  The right upper extremity was  prepped  and draped in the usual sterile fashion.  The distal part was  placed in Betadine solution.  Following, the extremity was prepped and  draped in the usual sterile fashion.  An Esmarch was used and tourniquet  was inflated to 250 mmHg.  The wound on the right long finger was  explored.  Nonviable skin, subcutaneous tissue, and bone were gently  debrided.  Following the distal part was removed from the Betadine  solution and evaluated.  The neurovascular bundles on both sides had  been stretched and pulled.  The overall condition of the part was not  suitable for replantation.  Following a composite graft of volar pad  from the distal part was fashioned.  This was then removed.  Some of the  nonviable tissue was debrided and this part was placed over the donor  site or the open wound of the distal right long finger.  Several pie  crusted notches were placed in this composite graft to allow for escape  of seroma or hematoma.  Following the  graft was sutured  circumferentially around the defect.  Several of the sutures were left  long and then a bolster with cotton and  Xeroform gauze were placed over the graft to apply some gentle pressure.  The tourniquet was then deflated and hemostasis was controlled.  Following the finger was placed in a sterile dressing and splint.  The  patient tolerated the procedure well and was taken to the recovery room  in stable condition.      Johnette Abraham, MD  Electronically Signed     HCC/MEDQ  D:  02/19/2008  T:  02/19/2008  Job:  [id number]

## 2011-05-31 ENCOUNTER — Encounter (HOSPITAL_COMMUNITY): Payer: Self-pay

## 2011-05-31 ENCOUNTER — Encounter (HOSPITAL_COMMUNITY)
Admission: RE | Admit: 2011-05-31 | Discharge: 2011-05-31 | Disposition: A | Discharge status: Home / Self Care | Payer: Medicaid Other | Source: Ambulatory Visit | Attending: Oncology | Admitting: Oncology

## 2011-05-31 DIAGNOSIS — C329 Malignant neoplasm of larynx, unspecified: Secondary | ICD-10-CM | POA: Insufficient documentation

## 2011-05-31 HISTORY — DX: Malignant neoplasm of supraglottis: C32.1

## 2011-05-31 MED ORDER — FLUDEOXYGLUCOSE F - 18 (FDG) INJECTION: 17.4000 | Freq: Once | INTRAVENOUS | Status: AC | PRN

## 2011-06-06 ENCOUNTER — Ambulatory Visit
Admission: RE | Admit: 2011-06-06 | Discharge: 2011-06-06 | Disposition: A | Discharge status: Home / Self Care | Payer: Medicaid Other | Source: Ambulatory Visit | Attending: Radiation Oncology | Admitting: Radiation Oncology

## 2011-06-06 ENCOUNTER — Encounter (HOSPITAL_BASED_OUTPATIENT_CLINIC_OR_DEPARTMENT_OTHER): Payer: Medicaid Other | Admitting: Oncology

## 2011-06-06 DIAGNOSIS — E46 Unspecified protein-calorie malnutrition: Secondary | ICD-10-CM

## 2011-06-06 DIAGNOSIS — C321 Malignant neoplasm of supraglottis: Secondary | ICD-10-CM

## 2011-07-01 ENCOUNTER — Encounter (HOSPITAL_COMMUNITY)
Admission: RE | Admit: 2011-07-01 | Discharge: 2011-07-01 | Disposition: A | Discharge status: Home / Self Care | Payer: Medicaid Other | Source: Ambulatory Visit | Attending: Otolaryngology | Admitting: Otolaryngology

## 2011-07-01 LAB — CBC
MCH: 33.1 pg (ref 26.0–34.0)
MCV: 93.6 fL (ref 78.0–100.0)
Platelets: 248 10*3/uL (ref 150–400)
RDW: 14.2 % (ref 11.5–15.5)

## 2011-07-01 LAB — BASIC METABOLIC PANEL
Calcium: 9.9 mg/dL (ref 8.4–10.5)
Creatinine, Ser: 0.95 mg/dL (ref 0.50–1.35)
GFR calc Af Amer: >60 mL/min (ref >60)

## 2011-07-01 LAB — SURGICAL PCR SCREEN: MRSA, PCR: NEGATIVE

## 2011-07-02 ENCOUNTER — Ambulatory Visit: Payer: Medicaid Other | Attending: Otolaryngology

## 2011-07-02 DIAGNOSIS — R490 Dysphonia: Secondary | ICD-10-CM | POA: Insufficient documentation

## 2011-07-02 DIAGNOSIS — IMO0001 Reserved for inherently not codable concepts without codable children: Secondary | ICD-10-CM | POA: Insufficient documentation

## 2011-07-03 ENCOUNTER — Ambulatory Visit (HOSPITAL_COMMUNITY)
Admission: RE | Admit: 2011-07-03 | Discharge: 2011-07-03 | Disposition: A | Discharge status: Home / Self Care | Payer: Medicaid Other | Source: Ambulatory Visit | Attending: Otolaryngology | Admitting: Otolaryngology

## 2011-07-03 ENCOUNTER — Other Ambulatory Visit: Payer: Self-pay | Admitting: Otolaryngology

## 2011-07-03 DIAGNOSIS — K219 Gastro-esophageal reflux disease without esophagitis: Secondary | ICD-10-CM | POA: Insufficient documentation

## 2011-07-03 DIAGNOSIS — F17200 Nicotine dependence, unspecified, uncomplicated: Secondary | ICD-10-CM | POA: Insufficient documentation

## 2011-07-03 DIAGNOSIS — J384 Edema of larynx: Secondary | ICD-10-CM | POA: Insufficient documentation

## 2011-07-03 DIAGNOSIS — Z8521 Personal history of malignant neoplasm of larynx: Secondary | ICD-10-CM | POA: Insufficient documentation

## 2011-07-03 DIAGNOSIS — J040 Acute laryngitis: Secondary | ICD-10-CM | POA: Insufficient documentation

## 2011-07-03 DIAGNOSIS — Z01812 Encounter for preprocedural laboratory examination: Secondary | ICD-10-CM | POA: Insufficient documentation

## 2011-07-03 DIAGNOSIS — J45909 Unspecified asthma, uncomplicated: Secondary | ICD-10-CM | POA: Insufficient documentation

## 2011-07-10 ENCOUNTER — Other Ambulatory Visit (HOSPITAL_COMMUNITY): Payer: Self-pay

## 2011-07-18 ENCOUNTER — Emergency Department (HOSPITAL_COMMUNITY)
Admission: EM | Admit: 2011-07-18 | Discharge: 2011-07-19 | Disposition: A | Discharge status: Home / Self Care | Payer: Medicaid Other | Attending: Emergency Medicine | Admitting: Emergency Medicine

## 2011-07-18 DIAGNOSIS — S1093XA Contusion of unspecified part of neck, initial encounter: Secondary | ICD-10-CM | POA: Insufficient documentation

## 2011-07-18 DIAGNOSIS — M542 Cervicalgia: Secondary | ICD-10-CM | POA: Insufficient documentation

## 2011-07-18 DIAGNOSIS — Z931 Gastrostomy status: Secondary | ICD-10-CM | POA: Insufficient documentation

## 2011-07-18 DIAGNOSIS — S01119A Laceration without foreign body of unspecified eyelid and periocular area, initial encounter: Secondary | ICD-10-CM | POA: Insufficient documentation

## 2011-07-18 DIAGNOSIS — S0003XA Contusion of scalp, initial encounter: Secondary | ICD-10-CM | POA: Insufficient documentation

## 2011-07-18 DIAGNOSIS — S0180XA Unspecified open wound of other part of head, initial encounter: Secondary | ICD-10-CM | POA: Insufficient documentation

## 2011-07-18 DIAGNOSIS — M25519 Pain in unspecified shoulder: Secondary | ICD-10-CM | POA: Insufficient documentation

## 2011-07-18 DIAGNOSIS — W1789XA Other fall from one level to another, initial encounter: Secondary | ICD-10-CM | POA: Insufficient documentation

## 2011-07-18 DIAGNOSIS — R51 Headache: Secondary | ICD-10-CM | POA: Insufficient documentation

## 2011-07-18 DIAGNOSIS — C329 Malignant neoplasm of larynx, unspecified: Secondary | ICD-10-CM | POA: Insufficient documentation

## 2011-07-19 ENCOUNTER — Emergency Department (HOSPITAL_COMMUNITY): Payer: Medicaid Other

## 2011-07-19 ENCOUNTER — Encounter (HOSPITAL_COMMUNITY): Payer: Self-pay

## 2011-08-07 ENCOUNTER — Encounter (HOSPITAL_BASED_OUTPATIENT_CLINIC_OR_DEPARTMENT_OTHER): Payer: Medicaid Other | Admitting: Oncology

## 2011-08-07 ENCOUNTER — Ambulatory Visit
Admission: RE | Admit: 2011-08-07 | Discharge: 2011-08-07 | Disposition: A | Discharge status: Home / Self Care | Payer: Medicaid Other | Source: Ambulatory Visit | Attending: Radiation Oncology | Admitting: Radiation Oncology

## 2011-08-07 ENCOUNTER — Other Ambulatory Visit: Payer: Self-pay | Admitting: Oncology

## 2011-08-07 DIAGNOSIS — C329 Malignant neoplasm of larynx, unspecified: Secondary | ICD-10-CM

## 2011-08-07 DIAGNOSIS — E46 Unspecified protein-calorie malnutrition: Secondary | ICD-10-CM

## 2011-08-07 DIAGNOSIS — C321 Malignant neoplasm of supraglottis: Secondary | ICD-10-CM

## 2011-08-20 NOTE — Op Note (Signed)
Eric Dorsey, Eric Dorsey NO.:  [id number]  MEDICAL RECORD NO.:  [id number]  LOCATION:  [facility]                         FACILITY:  [facility]  PHYSICIAN:  Zola Button T. Lazarus Salines, M.D. DATE OF BIRTH:  1956/05/20  DATE OF PROCEDURE:  07/03/2011 DATE OF DISCHARGE:                              OPERATIVE REPORT   PREOPERATIVE DIAGNOSIS:  Recurrent laryngeal cancer.  POSTOPERATIVE DIAGNOSIS:  Laryngeal inflammation, status post radiation.  PROCEDURE PERFORMED:  Direct laryngoscopy and biopsy, esophagoscopy.  SURGEON:  Gloris Manchester. Gwendloyn Forsee, MD  ANESTHESIA:  General orotracheal.  BLOOD LOSS:  Minimal.  COMPLICATIONS:  None.  FINDINGS:  Hypopharyngeal and laryngeal edema.  Slight irregularity overlying the right arytenoid.  No gross lesions.  Narrowing at the cricopharyngeus which was dilated with the insertion of the cervical esophagoscope without difficulty.  Biopsy was taken from the right arytenoid mucosa, negative by frozen section.  Repeat biopsy was taken from the right arytenoid and from the left arytenoid, also benign by frozen section.  PROCEDURE:  With the patient in a comfortable supine position, general orotracheal anesthesia was induced without difficulty.  At an appropriate level, the table was turned 90 degrees and the patient was placed in a slight reverse Trendelenburg.  A moist 4 x 4 was used to protect the upper alveolus.  Taking care to protect lips, teeth, and endotracheal tube, the Dedo anterior commissure laryngoscope was introduced and careful inspection of the oropharynx, hypopharynx, and endolarynx was performed.  The findings were as described above.  The endotracheal tube was displaced anteriorly and posteriorly to more thoroughly evaluate the glottis with the findings as described above. The laryngoscope was removed.  Cervical esophagoscope was lubricated, inserted into the pharynx, and with gentle pressure, opened into the cricopharyngeus region.   There was some apparent dilation of the mucosa, but no bleeding occurred.  The scope was passed easily to its full length and then removed with no significant findings.  The anterior commissure laryngoscope was reintroduced and the larynx was inspected once again including special emphasis into the valleculae, laryngeal surface of the epiglottis, piriform sinuses, postcricoid area, and into the glottis proper.  Several biopsies were taken from the irregularity at the right arytenoid and sent off for frozen section. Hemostasis was spontaneous.  The laryngoscope was removed.  The moist alveolar protector was removed.  Given the concern about positive PET scans and irregularity which was felt to be consistent with carcinoma, intention was paid to preparing for total laryngectomy.  The table was turned further.  A shoulder roll was placed and the neck was extended and the head supported.  The neck was palpated with the findings as described above.  A sterile preparation and draping of the neck was accomplished in the standard fashion.  No further surgery was performed at this point pending the frozen section results.  Frozen section returned as benign.  It was felt indicated to return to laryngoscopy and attempt additional biopsies.  Therefore, the drapes were carefully removed and discarded.  A moist 4 x 4 was used once again to protect the alveolus.  The anterior commissure laryngoscope was introduced and the entire hypopharynx and larynx was once again inspected.  While waiting for the frozen section, the previous CT scans and PET scans were reviewed.  After receiving the frozen section and returning into the pharynx, there was some activity in the left side on the PET scan, although minimal irregularity on laryngoscopy.  However, several biopsies were taken over the left arytenoid mucosa and sent for frozen section.  An additional deeper biopsy was taken at the right arytenoid and  also sent for frozen section.  Once again, hemostasis was spontaneous.  Frozen section finally returned as benign at both sites.  At this point, the procedure was completed.  The pharynx was suctioned clear.  The patient was returned to Anesthesia, awakened, extubated, and transferred to recovery in stable condition.  COMMENT:  This is a 55 year old black male with a history of a large right posterior supraglottic carcinoma treated with radiation and chemotherapy earlier this year, has had positive and progressive findings on PET scan, most recently in June 2012.  All of his examination has remained negative.  On direct laryngoscopy today, there was a small area of irregularity overlying the right arytenoid, which was benign by frozen section.  Additional biopsies in the area suspicious on the PET scan were also returned benign by frozen section. We will wait permanent pathologic interpretation.  If he should have a positive biopsy, we will return and complete laryngectomy.  If the findings are all benign, then we will continue to observe for possible chronic inflammation following radiation.  Given low anticipated risk of postanesthetic or postsurgical complications, I feel an outpatient venue is appropriate.     Gloris Manchester. Lazarus Salines, M.D.     KTW/MEDQ  D:  07/03/2011  T:  07/04/2011  Job:  [id number]  cc:   Exie Parody, M.D. Billie Lade, Ph.D., M.D. Marcene Duos, M.D.  Electronically Signed by Flo Shanks M.D. on 08/20/2011 09:54:46 AM

## 2011-11-06 IMAGING — CT CT MAXILLOFACIAL W/O CM
4 of 8 series · 17 of 47 positions shown, 19 images · non-contrast
Comparison: None.

CT HEAD

CLINICAL DATA: Fall, head injury

CT HEAD WITHOUT CONTRAST
CT MAXILLOFACIAL WITHOUT CONTRAST
CT CERVICAL SPINE WITHOUT CONTRAST
TECHNIQUE: Multidetector CT imaging of the head, cervical spine,
and maxillofacial structures were performed using the standard
protocol without intravenous contrast. Multiplanar CT image
reconstructions of the cervical spine and maxillofacial structures
were also generated.

[Series 6: orbit 2.0 h32s · axial · 0.33mm/px · z∈[+1098,+1220]mm · 7 of 83 slices shown, 9 images]
[im 11/83  brain]
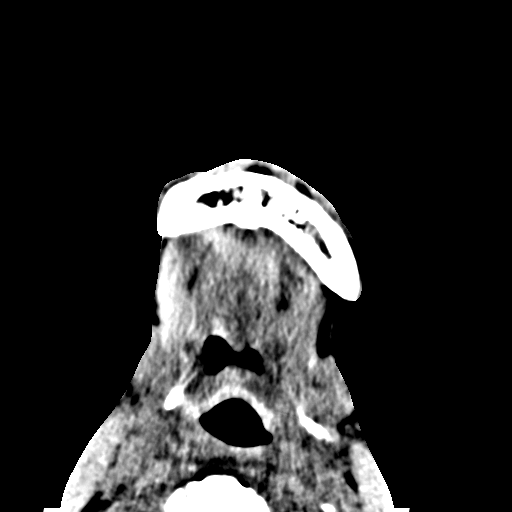
[im 11/83  bone]
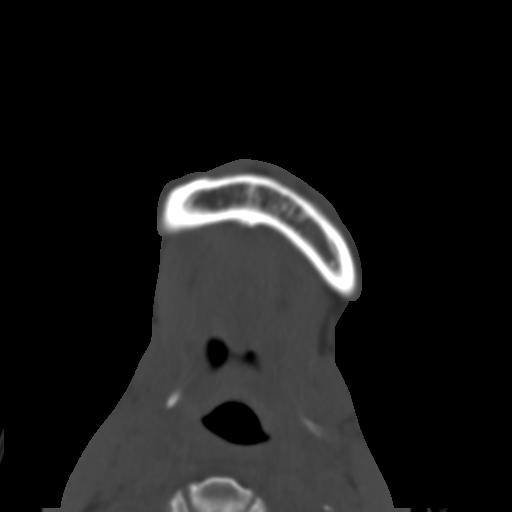
[im 21/83  bone]
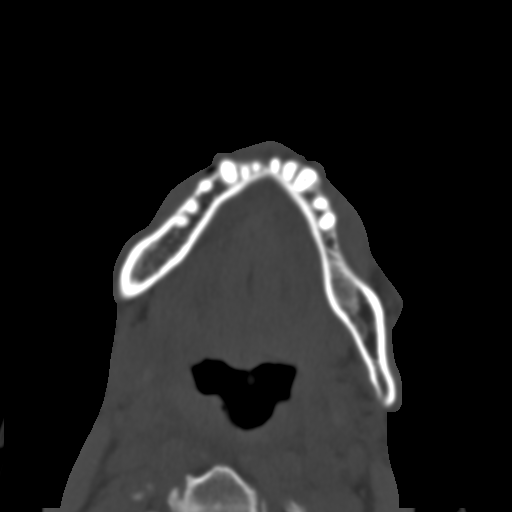
[im 31/83  bone]
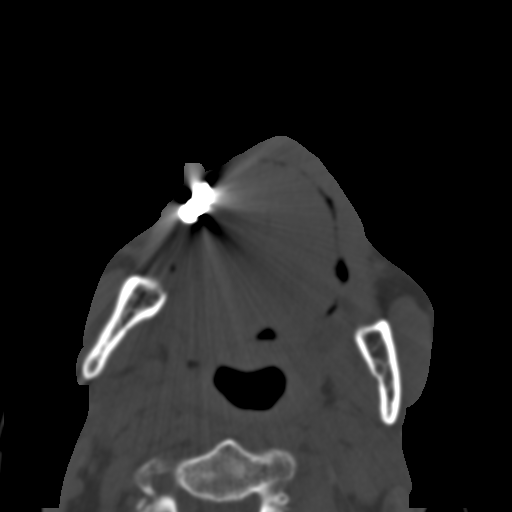
[im 42/83  bone]
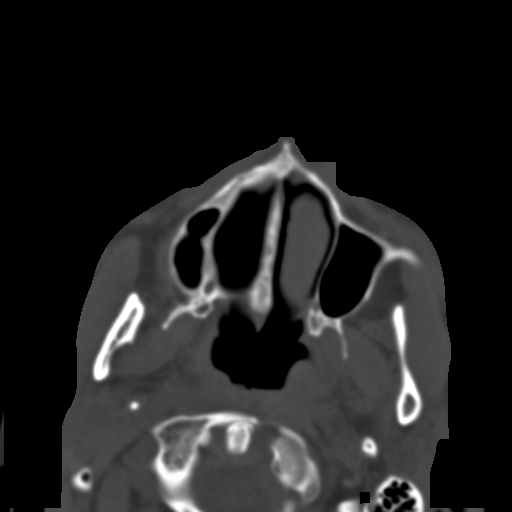
[im 52/83  brain]
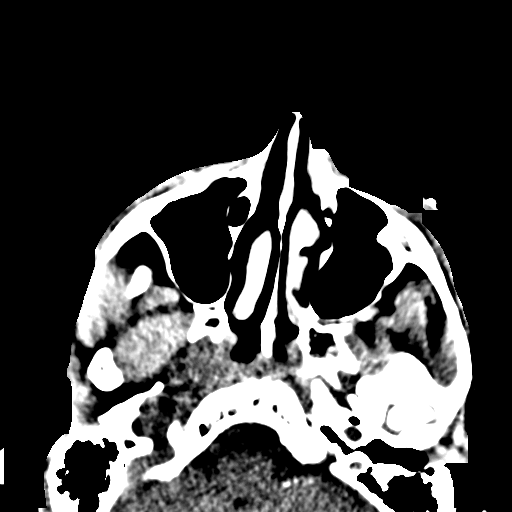
[im 52/83  bone]
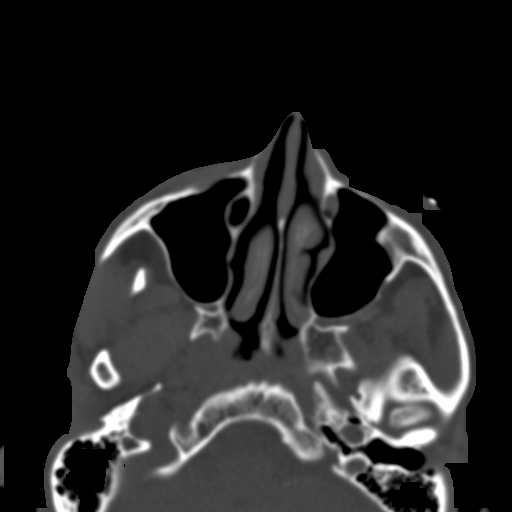
[im 62/83  bone]
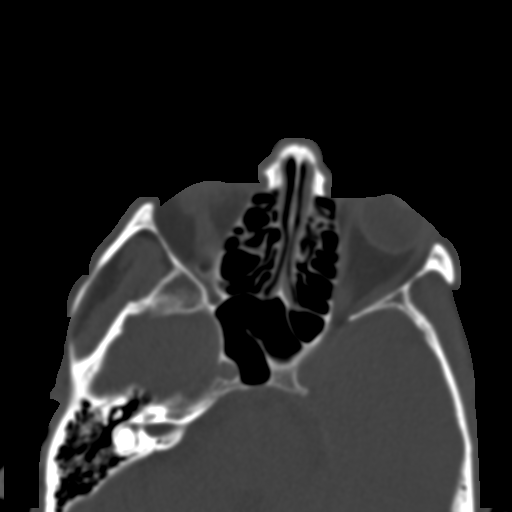
[im 72/83  bone]
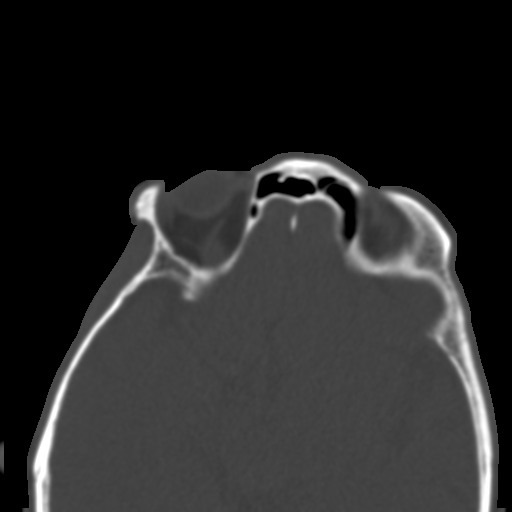

[Series 9: c_spine 2.0 b31s · axial · 0.23mm/px · z∈[+1034,+1094]mm · 4 of 81 slices shown]
[im 11/81  bone]
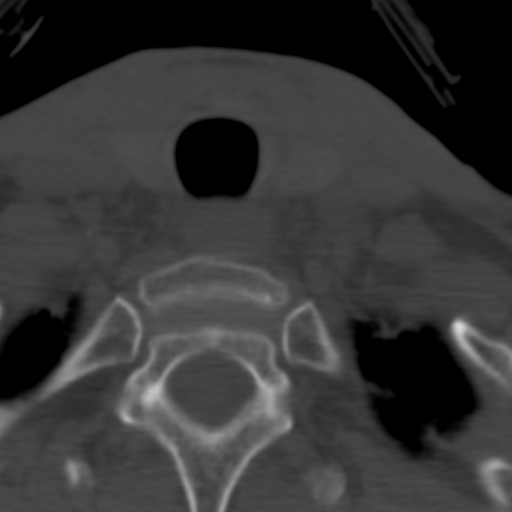
[im 21/81  bone]
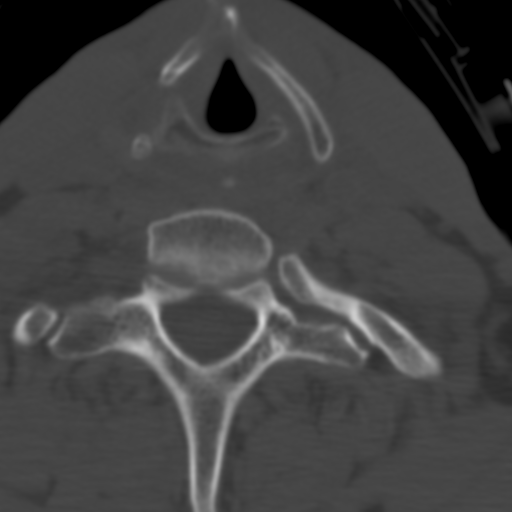
[im 31/81  bone]
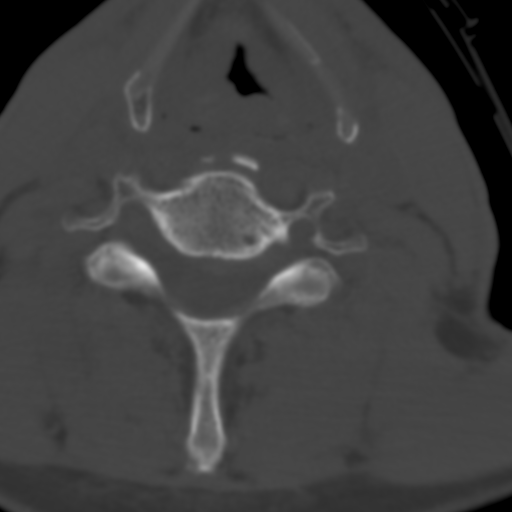
[im 41/81  bone]
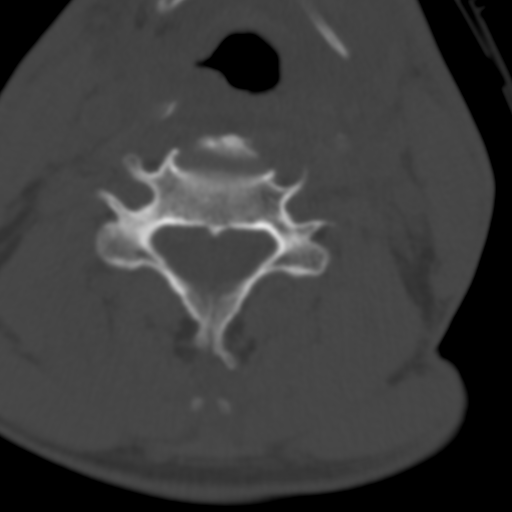

[Series 603: <mpr thick range(1)> · sagittal · 0.33mm/px · 3 of 77 slices shown]
[im 20/77  bone]
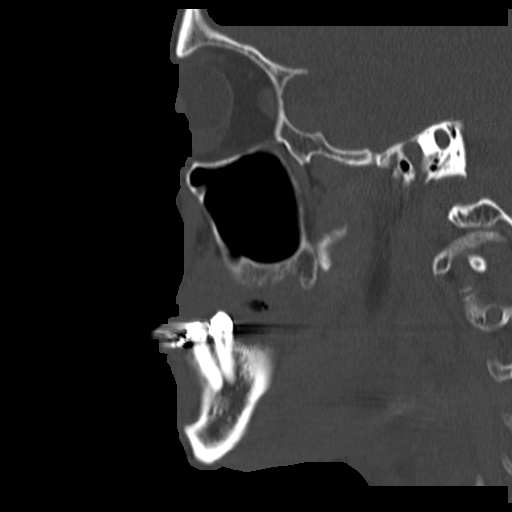
[im 39/77  bone]
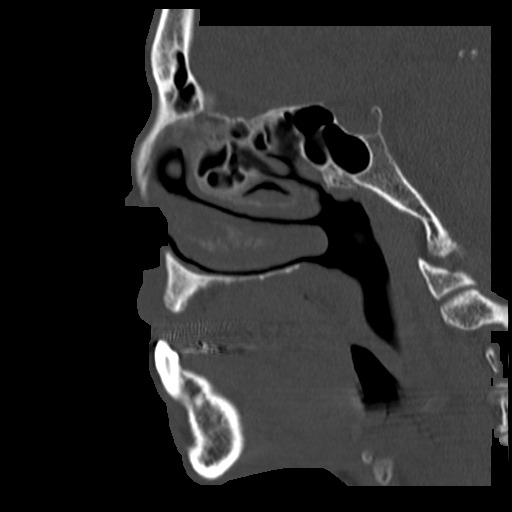
[im 58/77  bone]
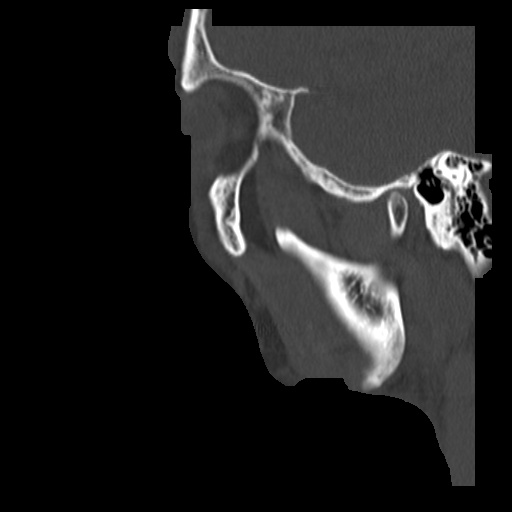

[Series 604: <mpr thick range(2)> · coronal · 0.31mm/px · 3 of 38 slices shown]
[im 7/38  bone]
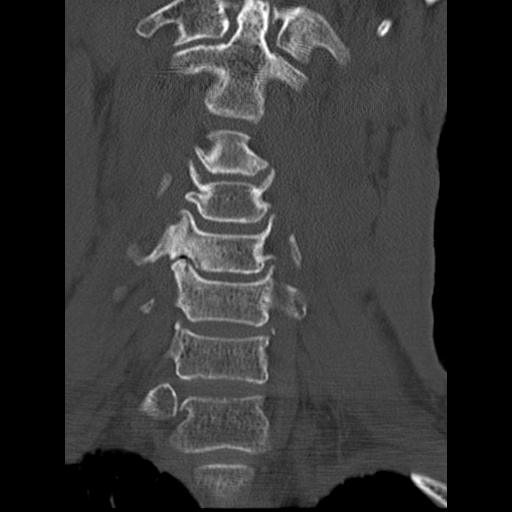
[im 17/38  bone]
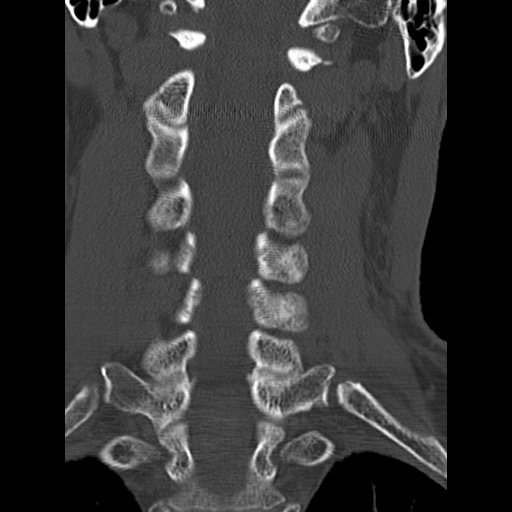
[im 27/38  bone]
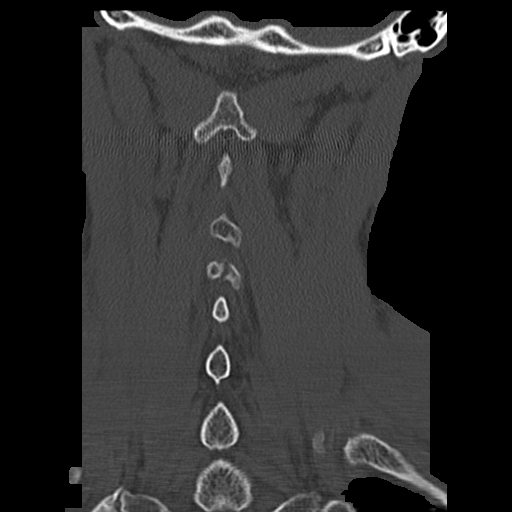

[17 of 47 positions shown; findings below may reference images not displayed]

FINDINGS: Ventricle size is normal.  Negative for intracranial
hemorrhage.  No acute infarct or mass.

Laceration and debris in the skin below the left eye.  Left frontal
scalp hematoma.  Negative for skull fracture.
IMPRESSION: No acute intracranial abnormality.

CT MAXILLOFACIAL
FINDINGS: Laceration below the left eye.  Several small fragments
of debris are  present within the laceration.  Left frontal scalp
hematoma.

Negative for facial fracture.  Negative for orbital fracture.  The
nasal bone is not broken.  No fracture of the mandible.
IMPRESSION: Negative for fracture.

Laceration containing debris below the left eye.

CT CERVICAL SPINE
FINDINGS: Negative for fracture.  Cervical kyphosis is present.
Disc degeneration and spondylosis C2-C7.  Facet joints are intact.
IMPRESSION: Negative for fracture.

## 2011-11-06 IMAGING — CR DG SHOULDER 2+V*R*
3 series · 3 of 3 positions shown · non-contrast
Comparison: None.

CLINICAL DATA: Fell down steps

RIGHT SHOULDER - 2+ VIEW

[view not recorded (1 of 3)]
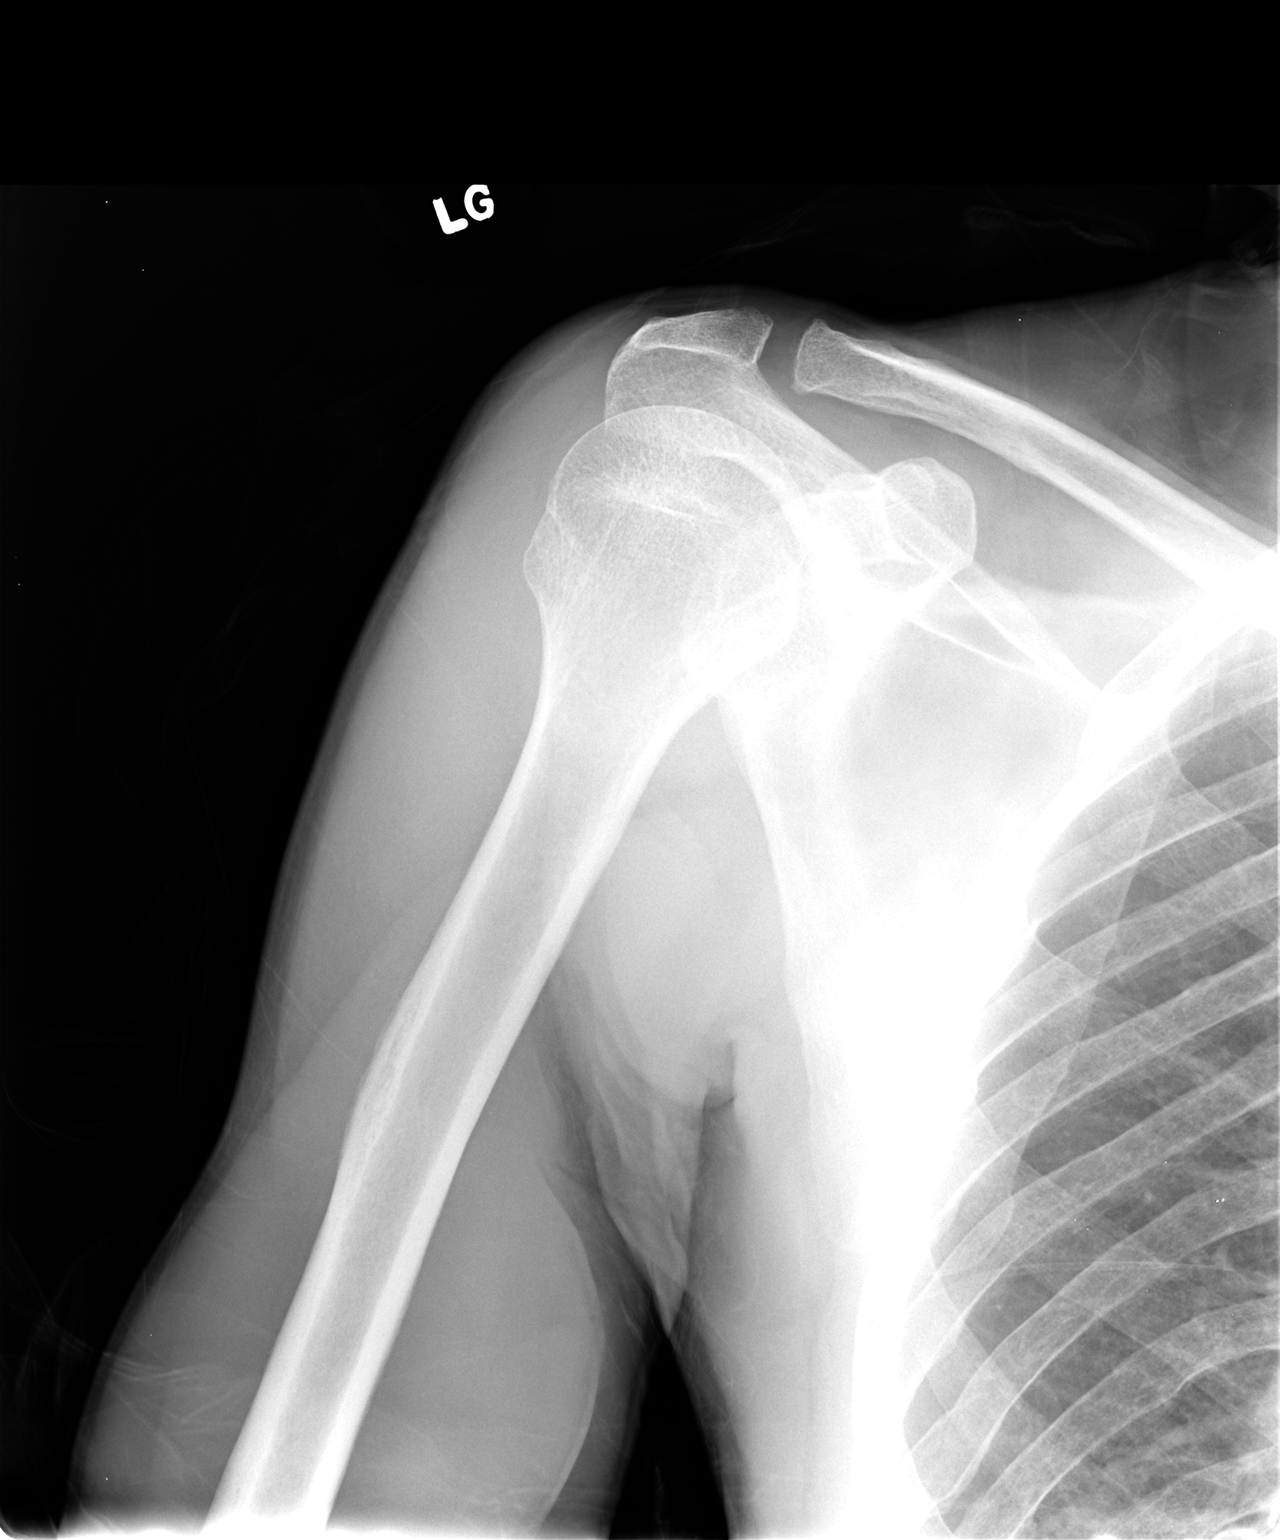

[view not recorded (2 of 3)]
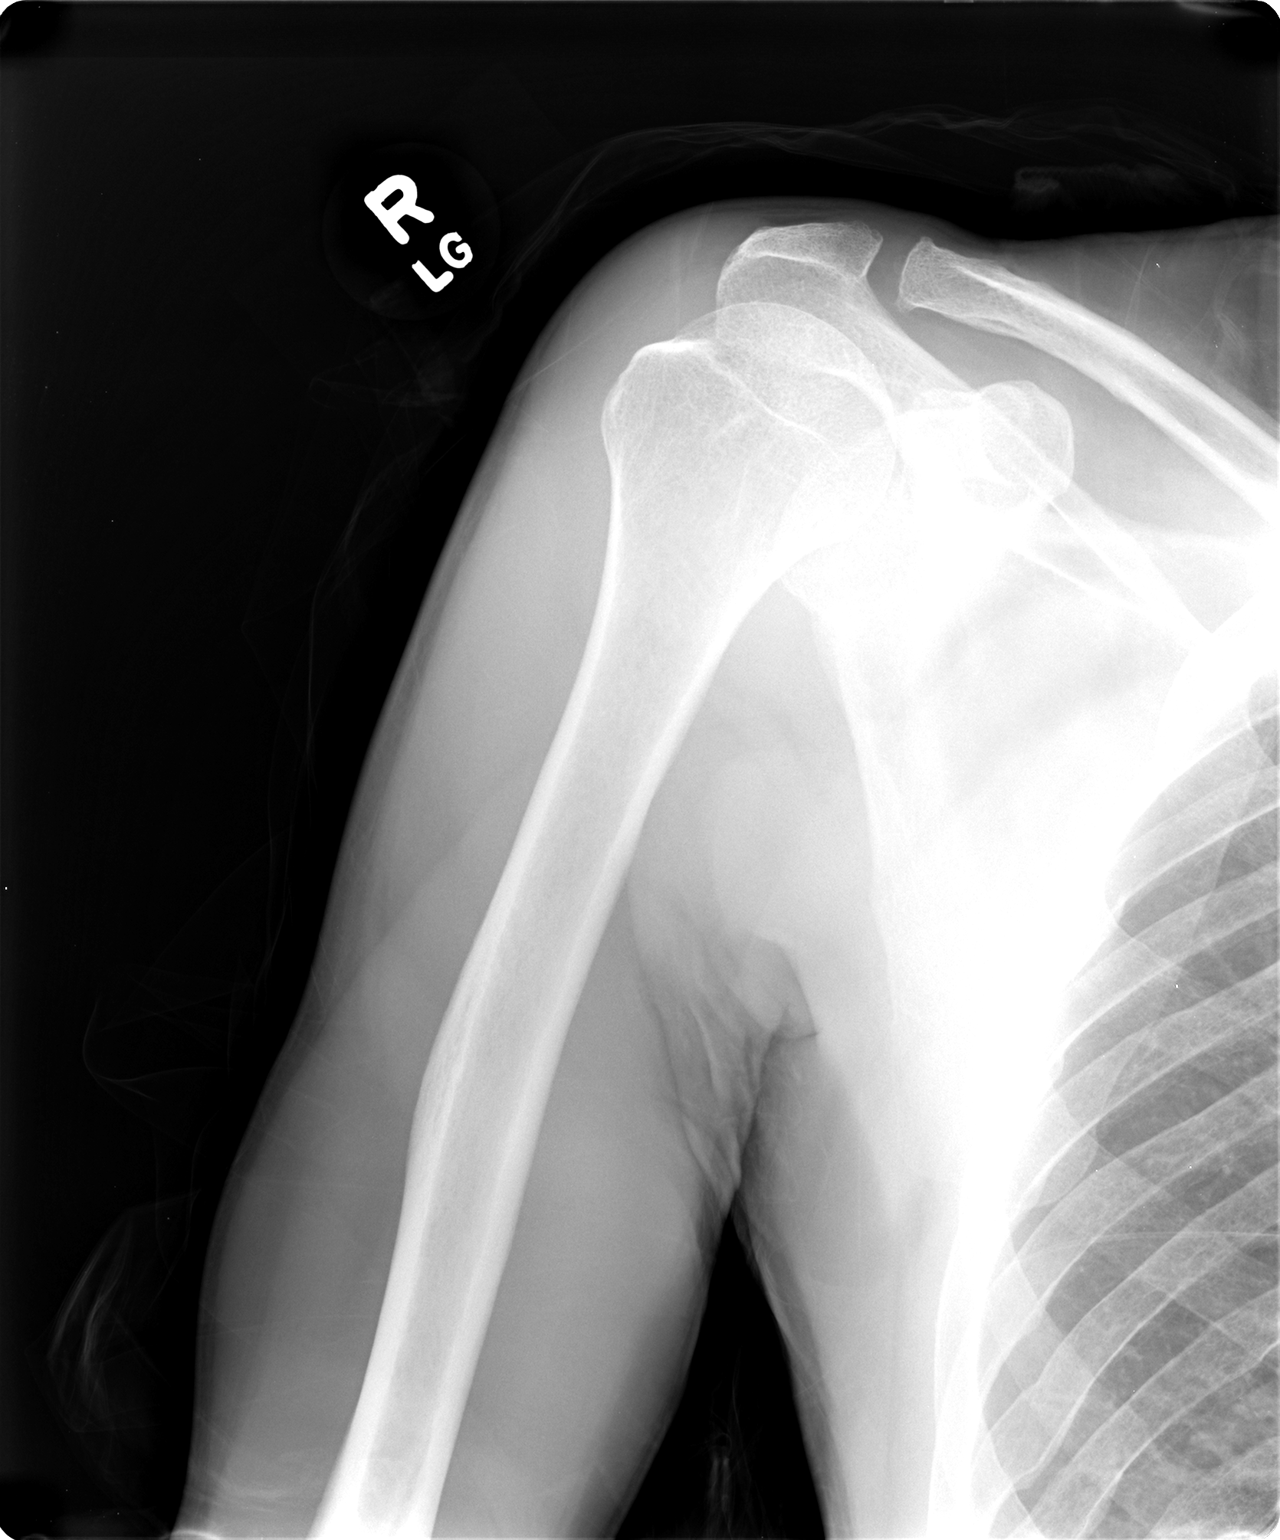

[view not recorded (3 of 3)]
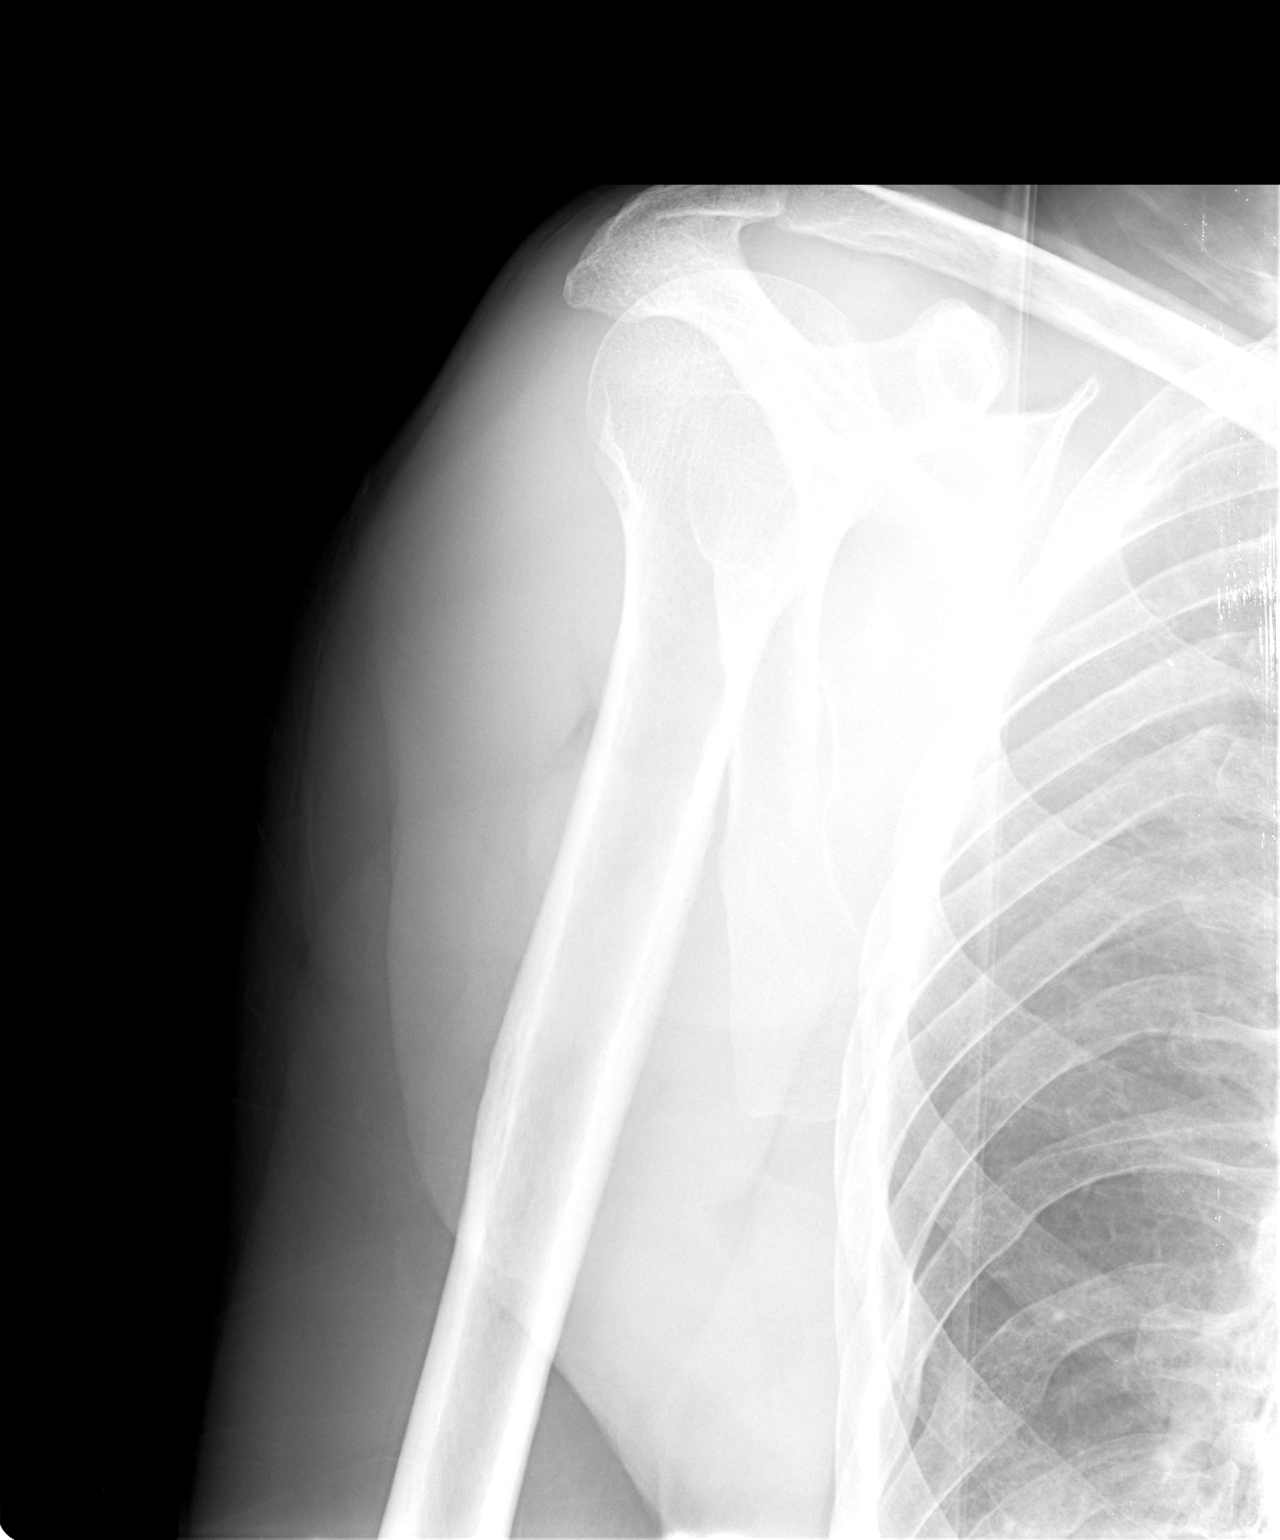

[3 of 3 positions shown; findings below may reference images not displayed]

FINDINGS: Negative for acute fracture.  Normal alignment.  AC joint
and shoulder joint appear normal.  Chronic fractures of multiple
right ribs.
IMPRESSION: Negative for acute fracture.

## 2011-11-22 ENCOUNTER — Encounter: Payer: Self-pay | Admitting: *Deleted

## 2011-11-25 ENCOUNTER — Encounter: Payer: Self-pay | Admitting: Oncology

## 2011-12-04 ENCOUNTER — Ambulatory Visit (HOSPITAL_COMMUNITY)
Admission: RE | Admit: 2011-12-04 | Discharge: 2011-12-04 | Disposition: A | Discharge status: Home / Self Care | Payer: Medicaid Other | Source: Ambulatory Visit | Attending: Oncology | Admitting: Oncology

## 2011-12-04 DIAGNOSIS — C329 Malignant neoplasm of larynx, unspecified: Secondary | ICD-10-CM | POA: Insufficient documentation

## 2011-12-04 DIAGNOSIS — I658 Occlusion and stenosis of other precerebral arteries: Secondary | ICD-10-CM | POA: Insufficient documentation

## 2011-12-04 DIAGNOSIS — M47812 Spondylosis without myelopathy or radiculopathy, cervical region: Secondary | ICD-10-CM | POA: Insufficient documentation

## 2011-12-04 DIAGNOSIS — I6529 Occlusion and stenosis of unspecified carotid artery: Secondary | ICD-10-CM | POA: Insufficient documentation

## 2011-12-04 MED ORDER — IOHEXOL 300 MG/ML  SOLN
100.0000 mL | Freq: Once | INTRAMUSCULAR | Status: AC | PRN
  Administered 2011-12-04: 100 mL via INTRAVENOUS

## 2011-12-05 ENCOUNTER — Ambulatory Visit (HOSPITAL_BASED_OUTPATIENT_CLINIC_OR_DEPARTMENT_OTHER): Payer: Medicaid Other | Admitting: Oncology

## 2011-12-05 ENCOUNTER — Ambulatory Visit: Payer: Medicaid Other | Admitting: Nutrition

## 2011-12-05 ENCOUNTER — Other Ambulatory Visit: Payer: Medicaid Other | Admitting: Lab

## 2011-12-05 VITALS — BP 124/85 | HR 115 | Temp 97.1°F | Ht 68.5 in | Wt 133.7 lb

## 2011-12-05 DIAGNOSIS — D491 Neoplasm of unspecified behavior of respiratory system: Secondary | ICD-10-CM

## 2011-12-05 DIAGNOSIS — E46 Unspecified protein-calorie malnutrition: Secondary | ICD-10-CM

## 2011-12-05 DIAGNOSIS — C321 Malignant neoplasm of supraglottis: Secondary | ICD-10-CM

## 2011-12-05 DIAGNOSIS — F17200 Nicotine dependence, unspecified, uncomplicated: Secondary | ICD-10-CM

## 2011-12-05 LAB — CBC WITH DIFFERENTIAL/PLATELET
BASO%: 0.5 % (ref 0.0–2.0)
Basophils Absolute: 0 10*3/uL (ref 0.0–0.1)
EOS%: 0.5 % (ref 0.0–7.0)
HCT: 35 % — ABNORMAL LOW (ref 38.4–49.9)
HGB: 12 g/dL — ABNORMAL LOW (ref 13.0–17.1)
MCH: 32.5 pg (ref 27.2–33.4)
MCHC: 34.3 g/dL (ref 32.0–36.0)
MCV: 94.9 fL (ref 79.3–98.0)
MONO%: 8.8 % (ref 0.0–14.0)
NEUT%: 64.9 % (ref 39.0–75.0)

## 2011-12-05 LAB — COMPREHENSIVE METABOLIC PANEL
AST: 28 U/L (ref 0–37)
Albumin: 4.9 g/dL (ref 3.5–5.2)
Alkaline Phosphatase: 87 U/L (ref 39–117)
BUN: 18 mg/dL (ref 6–23)
Creatinine, Ser: 1.01 mg/dL (ref 0.50–1.35)
Glucose, Bld: 83 mg/dL (ref 70–99)
Total Bilirubin: 0.3 mg/dL (ref 0.3–1.2)

## 2011-12-05 NOTE — Progress Notes (Signed)
Mr. Patella presents to nutrition followup.  His weight has decreased slightly to 133 pounds from 134 pounds.  He reports that has new dentures, but they need adjustment.  He is beginning to eat some solid foods.  He is drinking a 350 calorie nutritional supplement, an equivalent to Ensure Plus, twice a day by mouth.  He is continuing to use Jevity 1.2 via his feeding tube 6 cans daily.  He states he does eat breakfast, lunch and dinner.  He is beginning to get his taste back.  He reports that he feels like he requires a lot of calories because he is so active.  NUTRITION DIAGNOSIS:  Inadequate oral intake continues.  INTERVENTION:  I have educated the patient to attempt to increase foods at mealtimes using high-calorie high-protein foods.  I have encouraged him to increase his nutritional beverages as tolerated.  He will need to substitute 1 can 350 calorie oral supplement for 1 can of tube feeding. His goal is to reduce his tube feedings so that he no longer needs his feeding tube.  MONITORING/EVALUATION/GOALS:  The patient is increasing his oral intake, however, his weight is down slightly.  He will work to increase his oral intake and decrease tube feedings.  NEXT VISIT:  Monday, April 22nd.    ______________________________ Zenovia Jarred, RD, LDN Clinical Nutrition Specialist BN/MEDQ  D:  12/05/2011  T:  12/05/2011  Job:  [id number]

## 2011-12-05 NOTE — Progress Notes (Signed)
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  CURRENT THERAPY:  watchful observation.  INTERVAL HISTORY: Eric Dorsey 55 y.o. male returns for regular follow up.  He had abnormal CT neck in 07/2011 and had laryngoscopy by Dr. Lazarus Salines in September 2012 with negative exam and path.  He had a follow CT this week and returns to clinic today by himself to discuss result.  He reports that he still smokes a few cigarettes once every few days. He denies voice hoarseness, dysphagia, odynophagia, cervical node swelling.  He is able to drink Ensure up to about 350cal/day and eat some mechanical soft foods.  He still uses PEG tube with about 6 cans of Jevity daily.  He has no problem with oral intake; however, he has not aggressively transitioned from PEG to oral intake.  He has mild fatigue; however, he is independent of all activities of daily living and light chores around the house.  He denies PEG tube pain, erythema.   Patient denies  headache, visual changes, confusion, drenching night sweats, palpable lymph node swelling, mucositis, odynophagia, dysphagia, nausea vomiting, jaundice, chest pain, palpitation, shortness of breath, dyspnea on exertion, productive cough, gum bleeding, epistaxis, hematemesis, hemoptysis, abdominal pain, abdominal swelling, early satiety, melena, hematochezia, hematuria, skin rash, spontaneous bleeding, joint swelling, joint pain, heat or cold intolerance, bowel bladder incontinence, back pain, focal motor weakness, paresthesia, depression, suicidal or homocidal ideation, feeling hopelessness.   MEDICAL HISTORY: Past Medical History  Diagnosis Date  . Cancer of supraglottis     SURGICAL HISTORY: No past surgical history on  file.  MEDICATIONS: Current Outpatient Prescriptions  Medication Sig Dispense Refill  . omeprazole (PRILOSEC) 40 MG capsule Take 40 mg by mouth daily.          ALLERGIES:   has no known allergies.  REVIEW OF SYSTEMS:  The rest of the 14-point review of system was negative.   Filed Vitals:   12/05/11 1011  BP: 124/85  Pulse: 115  Temp: 97.1 F (36.2 C)   Wt Readings from Last 3 Encounters:  12/05/11 133 lb 11.2 oz (60.646 kg)  08/07/11 134 lb (60.782 kg)  10/09/10 155 lb (70.308 kg)   ECOG Performance status: 0-1  PHYSICAL EXAMINATION:  General:  well-nourished in no acute distress.  Eyes:  no scleral icterus.  ENT:  There were no oropharyngeal lesions on my unaided exam.  Neck was without thyromegaly.  Lymphatics:  Negative cervical, supraclavicular or axillary adenopathy.  Respiratory: lungs were clear bilaterally without wheezing or crackles.  Cardiovascular:  Regular rate and rhythm, S1/S2, without murmur, rub or gallop.  There was no pedal edema.  GI:  abdomen was soft, flat, nontender, nondistended, without organomegaly.  PEG tube was dry/clean/intact.  Muscoloskeletal:  no spinal tenderness of palpation of vertebral spine.  Skin exam was without echymosis, petichae.  Neuro exam was nonfocal.  Patient was able to get on and off exam table without assistance.  Gait was normal.  Patient was alerted and oriented.  Attention was good.   Language was appropriate.  Mood was normal without depression.  Speech was not pressured.  Thought content was not tangential.     LABORATORY/RADIOLOGY DATA:  Lab Results  Component Value Date   WBC 4.1 12/05/2011   HGB 12.0* 12/05/2011  HCT 35.0* 12/05/2011   PLT 259 12/05/2011   GLUCOSE 96 07/01/2011   ALT 17 04/11/2011   AST 27 04/11/2011   NA 137 07/01/2011   K 4.5 07/01/2011   CL 100 07/01/2011   CREATININE 0.95 07/01/2011   BUN 12 07/01/2011   CO2 27 07/01/2011   TSH 1.569 09/26/2010   INR 1.05 12/19/2010   IMAGING:  I personally  reviewed the neck CT below and showed the patient the images.  In brief, there was a supraglottic mass about 2cm.    Ct Soft Tissue Neck W Contrast  12/04/2011  *RADIOLOGY REPORT*  Clinical Data: Laryngeal cancer.  Treatment staging.  CT NECK WITH CONTRAST  Technique:  Multidetector CT imaging of the neck was performed with intravenous contrast.  Contrast: OMNIPAQUE IOHEXOL 300 MG/ML IV SOLN  Comparison: Prior PET scan 05/31/2011 and 04/08/2011.  CT neck 09/25/2010.  Findings: Suprahyoid neck:  Post XRT changes of submandibular glands. No parotid lesion.  No infratemporal fossa abnormality.  No mucosal lesion.  Dental amalgam.  No sinus or mastoid disease.  No nasopharyngeal or adenoidal lymphoid hypertrophy. No significant sinus or mastoid disease.  Larynx:  The patient is status post XRT for laryngeal cancer. Airway widely patent.  Preserved fat anterior commissure.  Slight asymmetry of cord position not clearly pathologic.  Supraglottic enhancing soft tissue lying directly above the arytenoids measuring 20 x 14 x 18 mm without clear-cut bony or cartilage destruction. Thickening left greater than right aryepiglottic folds. Local recurrence not excluded. Recommend PET correlation.  Infrahyoid neck:  No subglottic tumor. Normal thyroid.  No subcutaneous neck masses.  Lymph nodes:  No pathologic lymphadenopathy.  Upper chest/mediastinum:  No lung nodules. No visible adenopathy. Arch great vessels unremarkable.  Additional:  Carotid bifurcation calcification on the left is non stenotic.  Advanced cervical spondylosis is seen from C3-C7.  IMPRESSION: 20 x 14 x 18 suspected supraglottic mass as described. Recommend PET correlation.  Local recurrence not excluded.  Original Report Authenticated By: Elsie Stain, M.D.    ASSESSMENT AND PLAN:   1. History of supraglottic larynx squamous cell carcinoma.  I discussed with Mr. Hagemeister that I'm concerned for the enlarging supraglottic mass. Even though he  recently had a direct laryngoscopy in 08/2011.  I pretest probability of recurrent disease is very high.  I personally contacted Dr. Raye Sorrow office and asked his nurse to call patient for an appointment within the next few weeks.  I defer to Dr. Lazarus Salines to see whether repeat direct laryngoscopy is appropriate.  2. Current smoking:  I educated him on the very high risk of disease recurrence in patients with HNSCC and persistent smoking.  I advised him on OTC nicotine patch.  3. Calorie protein malnutrition.  His weight is stable.  I advised him to talk with dietician to see if he can aggressively transition to more oral intake.   4. Follow up with me in about 4 months with repeat CT neck.

## 2011-12-20 ENCOUNTER — Telehealth: Payer: Self-pay | Admitting: *Deleted

## 2011-12-26 ENCOUNTER — Telehealth: Payer: Self-pay | Admitting: *Deleted

## 2011-12-26 ENCOUNTER — Encounter (HOSPITAL_COMMUNITY): Payer: Self-pay | Admitting: Pharmacy Technician

## 2011-12-26 NOTE — Telephone Encounter (Signed)
Call from pt's sister,  States pt has a cold w/ cough and congestion.  Denies any fevers.  Asks if ok for pt to proceed w/ surgery in a few weeks?  Pt has appt to see Dr. Lazarus Salines on 12/31/11.  Instructed Jasmine December that pt may take OTC cold remedies including robitussin or mucinex.  Instructed to call his PCP if he develops a fever or worsening symptoms.  Instructed for pt to keep appt w/ Dr. Lazarus Salines and since he is the one doing the surgery, then he will assess pt at that time and be the MD to decide if he will proceed w/ surgery or not.  Jasmine December verbalized understanding.

## 2012-01-01 ENCOUNTER — Ambulatory Visit: Payer: Medicaid Other | Attending: Otolaryngology

## 2012-01-01 DIAGNOSIS — IMO0001 Reserved for inherently not codable concepts without codable children: Secondary | ICD-10-CM | POA: Insufficient documentation

## 2012-01-01 DIAGNOSIS — Z01818 Encounter for other preprocedural examination: Secondary | ICD-10-CM | POA: Insufficient documentation

## 2012-01-01 DIAGNOSIS — Z931 Gastrostomy status: Secondary | ICD-10-CM | POA: Insufficient documentation

## 2012-01-01 DIAGNOSIS — R490 Dysphonia: Secondary | ICD-10-CM | POA: Insufficient documentation

## 2012-01-02 ENCOUNTER — Encounter (HOSPITAL_COMMUNITY)
Admission: RE | Admit: 2012-01-02 | Discharge: 2012-01-02 | Disposition: A | Discharge status: Home / Self Care | Payer: Medicaid Other | Source: Ambulatory Visit | Attending: Anesthesiology | Admitting: Anesthesiology

## 2012-01-02 ENCOUNTER — Encounter (HOSPITAL_COMMUNITY): Payer: Self-pay

## 2012-01-02 ENCOUNTER — Encounter (HOSPITAL_COMMUNITY)
Admission: RE | Admit: 2012-01-02 | Discharge: 2012-01-02 | Disposition: A | Discharge status: Home / Self Care | Payer: Medicaid Other | Source: Ambulatory Visit | Attending: Otolaryngology | Admitting: Otolaryngology

## 2012-01-02 HISTORY — DX: Acute upper respiratory infection, unspecified: J06.9

## 2012-01-02 HISTORY — DX: Gastro-esophageal reflux disease without esophagitis: K21.9

## 2012-01-02 LAB — CBC
HCT: 34.2 % — ABNORMAL LOW (ref 39.0–52.0)
MCH: 32.2 pg (ref 26.0–34.0)
MCV: 95 fL (ref 78.0–100.0)
Platelets: 398 10*3/uL (ref 150–400)
RBC: 3.6 MIL/uL — ABNORMAL LOW (ref 4.22–5.81)

## 2012-01-02 LAB — BASIC METABOLIC PANEL
CO2: 29 mEq/L (ref 19–32)
Calcium: 10.3 mg/dL (ref 8.4–10.5)
Chloride: 100 mEq/L (ref 96–112)
Creatinine, Ser: 0.91 mg/dL (ref 0.50–1.35)
Glucose, Bld: 96 mg/dL (ref 70–99)

## 2012-01-02 LAB — TSH: TSH: 2.82 u[IU]/mL (ref 0.350–4.500)

## 2012-01-02 NOTE — Pre-Procedure Instructions (Signed)
20 Eric Dorsey  01/02/2012   Your procedure is scheduled on:   Thursday  01/09/12    Report to [facility] at 530 AM.  Call this number if you have problems the morning of surgery: [phone]   Remember:   Do not eat food:After Midnight.  May have clear liquids: up to 4 Hours before arrival.  Clear liquids include soda, tea, black coffee, apple or grape juice, broth.  Take these medicines the morning of surgery with A SIP OF WATER:     PRILOSEC    Do not wear jewelry, make-up or nail polish.  Do not wear lotions, powders, or perfumes. You may wear deodorant.  Do not shave 48 hours prior to surgery.  Do not bring valuables to the hospital.  Contacts, dentures or bridgework may not be worn into surgery.  Leave suitcase in the car. After surgery it may be brought to your room.  For patients admitted to the hospital, checkout time is 11:00 AM the day of discharge.   Patients discharged the day of surgery will not be allowed to drive home.  Name and phone number of your driver:   Special Instructions: CHG Shower Use Special Wash: 1/2 bottle night before surgery and 1/2 bottle morning of surgery.   Please read over the following fact sheets that you were given: Pain Booklet, Coughing and Deep Breathing, MRSA Information and Surgical Site Infection Prevention

## 2012-01-08 ENCOUNTER — Other Ambulatory Visit: Payer: Self-pay | Admitting: Otolaryngology

## 2012-01-08 DIAGNOSIS — Z23 Encounter for immunization: Secondary | ICD-10-CM

## 2012-01-08 MED ORDER — CLINDAMYCIN PHOSPHATE 900 MG/50ML IV SOLN
900.0000 mg | Freq: Once | INTRAVENOUS | Status: AC
  Administered 2012-01-09: 900 mg via INTRAVENOUS
  Filled 2012-01-08: qty 50
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
## 2012-01-09 ENCOUNTER — Encounter (HOSPITAL_COMMUNITY)
Admission: RE | Disposition: A | Discharge status: Home / Self Care | Payer: Self-pay | Source: Ambulatory Visit | Attending: Otolaryngology

## 2012-01-09 ENCOUNTER — Encounter (HOSPITAL_COMMUNITY): Payer: Self-pay | Admitting: Anesthesiology

## 2012-01-09 ENCOUNTER — Ambulatory Visit (HOSPITAL_COMMUNITY)
Admission: RE | Admit: 2012-01-09 | Discharge: 2012-01-10 | Disposition: A | Discharge status: Home / Self Care | Payer: Medicaid Other | Source: Ambulatory Visit | Attending: Otolaryngology | Admitting: Otolaryngology

## 2012-01-09 ENCOUNTER — Ambulatory Visit (HOSPITAL_COMMUNITY): Payer: Medicaid Other | Admitting: Anesthesiology

## 2012-01-09 ENCOUNTER — Encounter (HOSPITAL_COMMUNITY): Payer: Self-pay | Admitting: *Deleted

## 2012-01-09 ENCOUNTER — Other Ambulatory Visit: Payer: Self-pay | Admitting: Otolaryngology

## 2012-01-09 DIAGNOSIS — Z8521 Personal history of malignant neoplasm of larynx: Secondary | ICD-10-CM | POA: Insufficient documentation

## 2012-01-09 DIAGNOSIS — Z87891 Personal history of nicotine dependence: Secondary | ICD-10-CM | POA: Insufficient documentation

## 2012-01-09 DIAGNOSIS — J384 Edema of larynx: Secondary | ICD-10-CM | POA: Insufficient documentation

## 2012-01-09 DIAGNOSIS — K219 Gastro-esophageal reflux disease without esophagitis: Secondary | ICD-10-CM | POA: Insufficient documentation

## 2012-01-09 HISTORY — PX: LARYNGOSCOPY: SHX5203

## 2012-01-09 SURGERY — LARYNGOSCOPY
Anesthesia: General | Wound class: Clean Contaminated

## 2012-01-09 MED ORDER — ONDANSETRON HCL 4 MG/2ML IJ SOLN: 4.0000 mg | INTRAMUSCULAR | Status: DC | PRN

## 2012-01-09 MED ORDER — PHENYLEPHRINE HCL 10 MG/ML IJ SOLN
INTRAMUSCULAR | Status: DC | PRN
  Administered 2012-01-09: 80 ug via INTRAVENOUS

## 2012-01-09 MED ORDER — LACTATED RINGERS IV SOLN
INTRAVENOUS | Status: DC | PRN
  Administered 2012-01-09 (×2): via INTRAVENOUS

## 2012-01-09 MED ORDER — DEXAMETHASONE SODIUM PHOSPHATE 4 MG/ML IJ SOLN: 8.0000 mg | Freq: Once | INTRAMUSCULAR | Status: DC

## 2012-01-09 MED ORDER — ONDANSETRON HCL 4 MG/2ML IJ SOLN: 4.0000 mg | Freq: Once | INTRAMUSCULAR | Status: DC | PRN

## 2012-01-09 MED ORDER — HYDROMORPHONE HCL PF 1 MG/ML IJ SOLN: 0.2500 mg | INTRAMUSCULAR | Status: DC | PRN

## 2012-01-09 MED ORDER — CLINDAMYCIN PHOSPHATE 900 MG/50ML IV SOLN: 900.0000 mg | Freq: Once | INTRAVENOUS | Status: DC

## 2012-01-09 MED ORDER — PROPOFOL 10 MG/ML IV EMUL
INTRAVENOUS | Status: DC | PRN
  Administered 2012-01-09: 200 mg via INTRAVENOUS

## 2012-01-09 MED ORDER — ONDANSETRON HCL 4 MG/2ML IJ SOLN
INTRAMUSCULAR | Status: DC | PRN
  Administered 2012-01-09: 4 mg via INTRAVENOUS

## 2012-01-09 MED ORDER — ONDANSETRON HCL 4 MG PO TABS: 4.0000 mg | ORAL_TABLET | ORAL | Status: DC | PRN

## 2012-01-09 MED ORDER — GLYCOPYRROLATE 0.2 MG/ML IJ SOLN
INTRAMUSCULAR | Status: DC | PRN
  Administered 2012-01-09: .6 mg via INTRAVENOUS

## 2012-01-09 MED ORDER — FENTANYL CITRATE 0.05 MG/ML IJ SOLN
INTRAMUSCULAR | Status: DC | PRN
  Administered 2012-01-09: 250 ug via INTRAVENOUS

## 2012-01-09 MED ORDER — ROCURONIUM BROMIDE 100 MG/10ML IV SOLN
INTRAVENOUS | Status: DC | PRN
  Administered 2012-01-09: 10 mg via INTRAVENOUS
  Administered 2012-01-09: 30 mg via INTRAVENOUS

## 2012-01-09 MED ORDER — DEXAMETHASONE SODIUM PHOSPHATE 10 MG/ML IJ SOLN
INTRAMUSCULAR | Status: AC
  Administered 2012-01-09: 10 mg via INTRAVENOUS
  Filled 2012-01-09: qty 1

## 2012-01-09 MED ORDER — OXYCODONE-ACETAMINOPHEN 5-325 MG/5ML PO SOLN: 5.0000 mL | ORAL | Status: DC | PRN

## 2012-01-09 MED ORDER — SUCCINYLCHOLINE CHLORIDE 20 MG/ML IJ SOLN
INTRAMUSCULAR | Status: DC | PRN
  Administered 2012-01-09: 100 mg via INTRAVENOUS

## 2012-01-09 MED ORDER — PANTOPRAZOLE SODIUM 40 MG PO TBEC
40.0000 mg | DELAYED_RELEASE_TABLET | Freq: Every day | ORAL | Status: DC
  Administered 2012-01-09: 40 mg via ORAL
  Filled 2012-01-09: qty 1

## 2012-01-09 MED ORDER — NEOSTIGMINE METHYLSULFATE 1 MG/ML IJ SOLN
INTRAMUSCULAR | Status: DC | PRN
  Administered 2012-01-09: 3 mg via INTRAVENOUS

## 2012-01-09 MED ORDER — SODIUM CHLORIDE 0.9 % IV SOLN
INTRAVENOUS | Status: DC
  Administered 2012-01-09 – 2012-01-10 (×3): via INTRAVENOUS

## 2012-01-09 MED ORDER — JEVITY 1.2 CAL PO LIQD
480.0000 mL | Freq: Three times a day (TID) | ORAL | Status: DC
  Administered 2012-01-09 (×2): 480 mL
  Filled 2012-01-09 (×9): qty 711

## 2012-01-09 MED ORDER — DEXAMETHASONE SODIUM PHOSPHATE 4 MG/ML IJ SOLN
8.0000 mg | Freq: Once | INTRAMUSCULAR | Status: DC
  Filled 2012-01-09: qty 2

## 2012-01-09 MED ORDER — WHITE PETROLATUM GEL
Status: AC
  Administered 2012-01-09: 17:00:00
  Filled 2012-01-09: qty 5

## 2012-01-09 MED ORDER — HYDROCODONE-ACETAMINOPHEN 7.5-500 MG/15ML PO SOLN: 10.0000 mL | ORAL | Status: DC | PRN

## 2012-01-09 SURGICAL SUPPLY — 79 items
APPLIER CLIP 9.375 SM OPEN (CLIP)
APR CLP SM 9.3 20 MLT OPN (CLIP)
ATTRACTOMAT 16X20 MAGNETIC DRP (DRAPES) ×1 IMPLANT
BAG DECANTER FOR FLEXI CONT (MISCELLANEOUS) IMPLANT
BALLN PULM 15 16.5 18X75 (BALLOONS)
BALLOON PULM 15 16.5 18X75 (BALLOONS) ×1 IMPLANT
BLADE AVERAGE 25X9 (BLADE) IMPLANT
BLADE SURG 15 STRL LF DISP TIS (BLADE) IMPLANT
BLADE SURG 15 STRL SS (BLADE)
BLADE SURG ROTATE 9660 (MISCELLANEOUS) IMPLANT
BUR CROSS CUT (BURR)
BUR SRG MED 1.2XXCUT FSSR (BURR) IMPLANT
BURR SRG MED 1.2XXCUT FSSR (BURR)
CANISTER SUCTION 2500CC (MISCELLANEOUS) ×2 IMPLANT
CLEANER TIP ELECTROSURG 2X2 (MISCELLANEOUS) ×1 IMPLANT
CLIP APPLIE 9.375 SM OPEN (CLIP) IMPLANT
CLOTH BEACON ORANGE TIMEOUT ST (SAFETY) ×2 IMPLANT
CONT SPEC 4OZ CLIKSEAL STRL BL (MISCELLANEOUS) ×3 IMPLANT
CORDS BIPOLAR (ELECTRODE) IMPLANT
COVER SURGICAL LIGHT HANDLE (MISCELLANEOUS) ×1 IMPLANT
COVER TABLE BACK 60X90 (DRAPES) ×2 IMPLANT
CRADLE DONUT ADULT HEAD (MISCELLANEOUS) IMPLANT
DRAIN SNY 7 FPER (WOUND CARE) IMPLANT
DRAPE PROXIMA HALF (DRAPES) ×2 IMPLANT
ELECT COATED BLADE 2.86 ST (ELECTRODE) ×1 IMPLANT
ELECT NDL BLADE 2-5/6 (NEEDLE) IMPLANT
ELECT NEEDLE BLADE 2-5/6 (NEEDLE) IMPLANT
ELECT REM PT RETURN 9FT ADLT (ELECTROSURGICAL)
ELECTRODE REM PT RTRN 9FT ADLT (ELECTROSURGICAL) ×1 IMPLANT
EVACUATOR 1/8 PVC DRAIN (DRAIN) IMPLANT
EVACUATOR SILICONE 100CC (DRAIN) IMPLANT
GAUZE SPONGE 4X4 16PLY XRAY LF (GAUZE/BANDAGES/DRESSINGS) ×2 IMPLANT
GLOVE ECLIPSE 6.5 STRL STRAW (GLOVE) ×1 IMPLANT
GLOVE ECLIPSE 8.0 STRL XLNG CF (GLOVE) ×2 IMPLANT
GLOVE SURG SS PI 6.5 STRL IVOR (GLOVE) ×2 IMPLANT
GOWN PREVENTION PLUS XLARGE (GOWN DISPOSABLE) ×2 IMPLANT
GOWN STRL NON-REIN LRG LVL3 (GOWN DISPOSABLE) ×3 IMPLANT
GUARD TEETH (MISCELLANEOUS) ×2 IMPLANT
KIT BASIN OR (CUSTOM PROCEDURE TRAY) ×2 IMPLANT
KIT ROOM TURNOVER OR (KITS) ×2 IMPLANT
LOCATOR NERVE 3 VOLT (DISPOSABLE) IMPLANT
MARKER SKIN DUAL TIP RULER LAB (MISCELLANEOUS) IMPLANT
NS IRRIG 1000ML POUR BTL (IV SOLUTION) ×2 IMPLANT
PAD ARMBOARD 7.5X6 YLW CONV (MISCELLANEOUS) ×2 IMPLANT
PATTIES SURGICAL .5 X3 (DISPOSABLE) IMPLANT
PENCIL BUTTON HOLSTER BLD 10FT (ELECTRODE) ×1 IMPLANT
PENCIL FOOT CONTROL (ELECTRODE) ×1 IMPLANT
SOLUTION ANTI FOG 6CC (MISCELLANEOUS) ×1 IMPLANT
SPECIMEN JAR MEDIUM (MISCELLANEOUS) IMPLANT
SPECIMEN JAR SMALL (MISCELLANEOUS) ×1 IMPLANT
SPONGE GAUZE 4X4 12PLY (GAUZE/BANDAGES/DRESSINGS) ×2 IMPLANT
SPONGE INTESTINAL PEANUT (DISPOSABLE) IMPLANT
SPONGE LAP 18X18 X RAY DECT (DISPOSABLE) IMPLANT
STAPLER VISISTAT 35W (STAPLE) ×1 IMPLANT
SURGILUBE 2OZ TUBE FLIPTOP (MISCELLANEOUS) ×1 IMPLANT
SUT CHROMIC 2 0 SH (SUTURE) ×1 IMPLANT
SUT CHROMIC 3 0 PS 2 (SUTURE) ×1 IMPLANT
SUT CHROMIC 3 0 SH 27 (SUTURE) ×1 IMPLANT
SUT CHROMIC 4 0 P 3 18 (SUTURE) IMPLANT
SUT ETHIBOND 2 0 SH (SUTURE)
SUT ETHIBOND 2 0 SH 36X2 (SUTURE) ×1 IMPLANT
SUT ETHILON 2 0 FS 18 (SUTURE) ×1 IMPLANT
SUT ETHILON 6 0 P 1 (SUTURE) IMPLANT
SUT PDS AB 4-0 P3 18 (SUTURE) ×1 IMPLANT
SUT SILK 2 0 FS (SUTURE) ×1 IMPLANT
SUT SILK 3 0 REEL (SUTURE) ×1 IMPLANT
SUT VIC AB 3-0 SH 27 (SUTURE)
SUT VIC AB 3-0 SH 27XBRD (SUTURE) ×1 IMPLANT
SYR INFLATE BILIARY GAUGE (MISCELLANEOUS) ×1 IMPLANT
TAPE UMBILICAL 1/8 X36 TWILL (MISCELLANEOUS) IMPLANT
TOWEL OR 17X24 6PK STRL BLUE (TOWEL DISPOSABLE) ×4 IMPLANT
TOWEL OR 17X26 10 PK STRL BLUE (TOWEL DISPOSABLE) ×1 IMPLANT
TOWEL OR NON WOVEN STRL DISP B (DISPOSABLE) ×1 IMPLANT
TRAP SPECIMEN MUCOUS 40CC (MISCELLANEOUS) IMPLANT
TRAY ENT MC OR (CUSTOM PROCEDURE TRAY) ×1 IMPLANT
TRAY FOLEY CATH 14FRSI W/METER (CATHETERS) IMPLANT
TUBE CONNECTING 12X1/4 (SUCTIONS) ×2 IMPLANT
TUBE FEEDING 10FR FLEXIFLO (MISCELLANEOUS) IMPLANT
WATER STERILE IRR 1000ML POUR (IV SOLUTION) ×1 IMPLANT

## 2012-01-09 NOTE — OR Nursing (Signed)
2nd frozen sent at 0858. Received at 0901.

## 2012-01-09 NOTE — Anesthesia Postprocedure Evaluation (Signed)
  Anesthesia Post-op Note  Patient: Eric Dorsey  Procedure(s) Performed:  LARYNGOSCOPY - Direct Laryngoscopy and Esophagoscopy with frozen sections  Patient Location: PACU  Anesthesia Type: General  Level of Consciousness: awake, alert , oriented and patient cooperative  Airway and Oxygen Therapy: Patient Spontanous Breathing and Patient connected to nasal cannula oxygen  Post-op Pain: none  Post-op Assessment: Post-op Vital signs reviewed, Patient's Cardiovascular Status Stable, Respiratory Function Stable, Patent Airway, No signs of Nausea or vomiting and Pain level controlled  Post-op Vital Signs: stable  Complications: No apparent anesthesia complications

## 2012-01-09 NOTE — OR Nursing (Signed)
Frozen sent [phone]. Histology called. Received at [phone].

## 2012-01-09 NOTE — Anesthesia Preprocedure Evaluation (Signed)
Anesthesia Evaluation    Reviewed: Allergy & Precautions, H&P , Patient's Chart, lab work & pertinent test results  Airway Mallampati: I TM Distance: >3 FB Neck ROM: full    Dental   Pulmonary    Pulmonary exam normal       Cardiovascular regular Normal    Neuro/Psych    GI/Hepatic GERD-  ,(+) Cirrhosis -       ,   Endo/Other    Renal/GU      Musculoskeletal   Abdominal   Peds  Hematology   Anesthesia Other Findings   Reproductive/Obstetrics                           Anesthesia Physical Anesthesia Plan  ASA: I  Anesthesia Plan: General ETT   Post-op Pain Management:    Induction: Intravenous  Airway Management Planned: Oral ETT  Additional Equipment:   Intra-op Plan:   Post-operative Plan: Extubation in OR  Informed Consent: I have reviewed the patients History and Physical, chart, labs and discussed the procedure including the risks, benefits and alternatives for the proposed anesthesia with the patient or authorized representative who has indicated his/her understanding and acceptance.     Plan Discussed with: Anesthesiologist, CRNA and Surgeon  Anesthesia Plan Comments:         Anesthesia Quick Evaluation

## 2012-01-09 NOTE — Transfer of Care (Signed)
Immediate Anesthesia Transfer of Care Note  Patient: Eric Dorsey  Procedure(s) Performed:  LARYNGOSCOPY - Direct Laryngoscopy and Esophagoscopy with frozen sections  Patient Location: PACU  Anesthesia Type: General  Level of Consciousness: awake, alert  and oriented  Airway & Oxygen Therapy: Patient Spontanous Breathing and Patient connected to nasal cannula oxygen  Post-op Assessment: Report given to PACU RN and Post -op Vital signs reviewed and stable  Post vital signs: Reviewed and stable  Complications: No apparent anesthesia complications

## 2012-01-09 NOTE — Anesthesia Procedure Notes (Addendum)
Procedure Name: Intubation Date/Time: 01/09/2012 8:05 AM Performed by: Gwenyth Allegra Pre-anesthesia Checklist: Patient identified, Timeout performed, Emergency Drugs available, Suction available and Patient being monitored Patient Re-evaluated:Patient Re-evaluated prior to inductionOxygen Delivery Method: Circle System Utilized Preoxygenation: Pre-oxygenation with 100% oxygen Intubation Type: IV induction Ventilation: Mask ventilation without difficulty and Oral airway inserted - appropriate to patient size Laryngoscope Size: Mac and 3 Grade View: Grade II Tube type: Oral Tube size: 7.5 mm Airway Equipment and Method: stylet Placement Confirmation: ETT inserted through vocal cords under direct vision,  breath sounds checked- equal and bilateral and positive ETCO2 Secured at: 22 cm Tube secured with: Tape Dental Injury: Teeth and Oropharynx as per pre-operative assessment    Performed by: Gwenyth Allegra

## 2012-01-09 NOTE — Op Note (Signed)
01/09/2012  9:59 AM    Eric Dorsey  [id number]   Pre-Op Dx:  Possible recurrent laryngeal cancer  Post-op Dx: laryngeal edema, no recurrent cancer   Proc: DL + Bx, Bronch, Esophagoscopy   Surg:  Flo Shanks T MD  Anes:  GOT  EBL:  10  Comp:  none  Findings:  Supraglottic edema s/p radiation therapy.  Slight surface irregularity over arytenoids.  Atypia on multiple frozen section biopsies over the arytenoids.  No identified cancer.  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  Procedure: With the patient in a comfortable supine position, GOT anesthesia was induced without difficulty.  At an appropriate level, the table was turned 90 degrees away from Anesthesia.  A clean preparation and draping was performed in the standard fashion.  A moist 4x4 was used to protect the upper alveolus.  Using the [facility] laryngoscope, the larynx was visualized.  The rod bronchoscope was passed by the ETT cuff and inspection down into the mainstem bronchus on each side was performed with the findings as described above.  The bronchoscope and laryngoscope were removed.  The anterior commissure laryngoscope was introduced taking care to protect lips, teeth, and endotracheal tube.  Complete laryngoscopy was performed in the standard fashion.  The findings were as described above.  The laryngoscope was removed.  The cervical esophagoscope was lubricated and inserted into the hypopharynx.  With gentle pressure, it was passed through the cricopharyngeus and advanced to its full length without difficulty with the findings as described above.  It was removed.  Upon withdrawal, a superficial mucosal separation at the cricopharyngeus consistent with dilation/lysis of stenosis was identified.  No bleeding noted.  The Anterior commissure laryngoscope was re-introduced.  Biopsies were taken over the arytenoids on both sides, and deeper biopsies were also taken on the LEFT.  Frozen section returned as  some suspicious atypia on the LEFT, inflammation on the RIGHT.  Additional biopsies were taken more deeply on the LEFT.  Again frozen section returned with atypia but no cancer.  The biopsy site on the LEFT arytenoid was cauterized.  Hemostasis was observed.  Total Laryngectomy was not felt to be indicated at this point.  The oropharynx, oral cavity, nasopharynx and hypopharynx were palpated with findings as described above.  The neck was palpated on both sides with the findings as described above.  At this point the procedure was completed.  Dental status was intact.  The patient was returned to Anesthesia, awakened, extubated, and transferred to PACU in satisfactory condition.   Dispo:   PACU to 23 ER.  Plan:  Observe for post-op swelling.  Probable D/C in AM.  Await permanent pathologic interpretation.  Cephus Richer MD

## 2012-01-09 NOTE — Preoperative (Signed)
Beta Blockers   Reason not to administer Beta Blockers:Not Applicable 

## 2012-01-09 NOTE — Interval H&P Note (Signed)
History and Physical Interval Note:  01/09/2012 7:49 AM  Eric Dorsey  has presented today for surgery, with the diagnosis of possible recurrent supraglottic cancer   The various methods of treatment have been discussed with the patient and family. After consideration of risks, benefits and other options for treatment, the patient has consented to  Procedure(s): LARYNGOSCOPY, Bronchoscopy, Esophagoscopy,LARYNGECTOMY as a surgical intervention .  The patients' history has been re-reviewed, patient re-examined, no change in status, stable for surgery.  I have reviewed the patients' chart and labs.  Questions were answered to the patient's satisfaction.     Cephus Richer

## 2012-01-09 NOTE — Progress Notes (Signed)
Dr. Lazarus Salines at bedside, talked to pt.
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
## 2012-01-10 ENCOUNTER — Encounter (HOSPITAL_COMMUNITY): Payer: Self-pay | Admitting: Otolaryngology

## 2012-01-10 MED ORDER — HYDROCODONE-ACETAMINOPHEN 7.5-500 MG/15ML PO SOLN: 10.0000 mL | ORAL | Status: AC | PRN
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
## 2012-01-10 NOTE — Discharge Summary (Signed)
Physician Discharge Summary  Patient ID: Eric Dorsey MRN: [id number] DOB/AGE: 1956/10/31 56 y.o.  Admit date: 01/09/2012 Discharge date: 01/10/2012  Admission Diagnoses:  Recurrent Laryngeal cancer    Discharge Diagnoses: No recurrent cancer.  Laryngeal edema Active Problems:  * No active hospital problems. *    Discharged Condition: good  Hospital Course: Underwent Direct Laryngoscopy and biopsy.  Frozen sections all negative.  Observed 23 hr ER to r/o post-op edema with obstruction.  No difficulty with breathing.  Consults: None  Significant Diagnostic Studies: none  Treatments: surgery: Direct Laryngoscopy with Biopsy, Esophagoscopy, Bronchoscopy  Discharge Exam: Blood pressure 143/73, pulse 89, temperature 97.8 F (36.6 C), temperature source Oral, resp. rate 16, height 5\' 8"  (1.727 m), weight 60.328 kg (133 lb), SpO2 100.00%. Breathing well.  Voice sl raspy but no stridor or labor.  Disposition: Home or Self Care  Discharge Orders    Future Appointments: Provider: Department: Dept Phone: Center:   02/27/2012 8:00 AM Lurline Hare, MD [facility] [phone] None   04/03/2012 9:45 AM Dava Najjar Elie Goody [facility] [phone] None   04/03/2012 10:30 AM [facility] [phone]    04/06/2012 10:30 AM Jethro Bolus, MD [facility] [phone] None   04/06/2012 11:15 AM Zenovia Jarred, RD [facility] [phone] None     Medication List  As of 01/10/2012  9:12 AM   ASK your doctor about these medications         omeprazole 40 MG capsule   Commonly known as: PRILOSEC   Take 40 mg by mouth daily.           Follow-up Information    Follow up with Cephus Richer, MD. Schedule an appointment as soon as possible for a visit in 2 weeks.   Contact information:   [facility] [address], [address] [place] [address] [phone]          Signed: Cephus Richer 01/10/2012, 9:12  AM

## 2012-01-10 NOTE — Progress Notes (Signed)
Pt given dc instructions. Rn went over medications and incision/wound care. Pt verbalized understanding and had no questions regarding care of instructions.  

## 2012-01-13 ENCOUNTER — Telehealth: Payer: Self-pay | Admitting: Nutrition

## 2012-01-13 NOTE — Telephone Encounter (Signed)
I have attempted to contact patient for nutrition follow up however, I have not been able to speak with him.

## 2012-02-11 ENCOUNTER — Ambulatory Visit: Payer: Medicaid Other | Admitting: Nutrition

## 2012-02-11 NOTE — Progress Notes (Signed)
Followup with the patient regarding tube feeding and oral intake.  I called Mr. Tamayo on the phone today and was able to reach him.  He reports he is doing some better.  His weight is stable although he did not tell me how much he weighs.  He reports that he is now decreasing his tube feedings of Jevity 1.2 to 4 cans daily and he is increasing his oral intake by eating a variety of soft moist foods as well as an oral supplement twice a day.  He is discouraged because he would like to gain weight.  However, his goal is to decrease his tube feedings so that he can have his feeding tube removal.  NUTRITION DIAGNOSIS:  Inadequate oral intake continues.  INTERVENTION:  I have encouraged the patient to continue to increase the amounts of soft moist foods he consumes.  I have asked him to purchase a higher calorie nutritional supplement for oral consumption between meals and to continue with Jevity 1.2 via PEG four cans daily instead of 8 total.  The patient verbalizes understanding. The patient will continue to decrease his TF and increase food and nutritional supplements.  MONITORING, EVALUATION, AND GOALS:  The patient has been able to increase his oral intake to decrease his tube feedings.  NEXT VISIT:  Monday, April 22nd.    ______________________________ Zenovia Jarred, RD, LDN Clinical Nutrition Specialist BN/MEDQ  D:  02/11/2012  T:  02/11/2012  Job:  [id number]

## 2012-02-26 DIAGNOSIS — C321 Malignant neoplasm of supraglottis: Secondary | ICD-10-CM | POA: Insufficient documentation

## 2012-02-27 ENCOUNTER — Ambulatory Visit
Admission: RE | Admit: 2012-02-27 | Discharge: 2012-02-27 | Disposition: A | Discharge status: Home / Self Care | Payer: Medicaid Other | Source: Ambulatory Visit | Attending: Radiation Oncology | Admitting: Radiation Oncology

## 2012-02-27 ENCOUNTER — Encounter: Payer: Self-pay | Admitting: Radiation Oncology

## 2012-02-27 VITALS — BP 123/74 | HR 95 | Temp 96.9°F | Resp 20 | Wt 138.4 lb

## 2012-02-27 DIAGNOSIS — C321 Malignant neoplasm of supraglottis: Secondary | ICD-10-CM

## 2012-02-27 DIAGNOSIS — C801 Malignant (primary) neoplasm, unspecified: Secondary | ICD-10-CM | POA: Insufficient documentation

## 2012-02-27 NOTE — Progress Notes (Signed)
CC:   Eric Dorsey, M.D. Eric Dorsey, M.D.  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  PREVIOUS TREATMENT:  Concurrent chemoradiotherapy to a total dose of 70 Gy completed 01/04/2011.  INTERVAL SINCE TREATMENT:  14 months.  INTERVAL HISTORY:  Eric Dorsey reports for followup today.  He, of course, had the CT scan in December which was concerning for recurrence, had a biopsy in January which was negative for malignancy.  He saw Dr. Lazarus Dorsey last month.  He has a CT of the neck scheduled for April 19th. He is still doing 6 cans of Jevity per his tube.  He is eating soft foods and states that with the swallowing exercises, that is getting easier.  He has no pain.  He is not working.  He does not wish to switch to bolus feeds at this time.  PHYSICAL EXAMINATION:  His weight is up 4 pounds to 138, temperature 96.9, pulse 95, respirations 20, blood pressure 123/74.  He has no palpable cervical or supraclavicular adenopathy.  Examination of the cervical and supraclavicular lymph nodes shows no evidence of disease. Indirect mirror examination shows 2 vocal cords which approximate at midline.  There is no evidence of mass.  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  RECOMMENDATIONS:  Eric Dorsey looks great.  He is scheduled for a CT in April.  We discussed monitoring him for 2 years.  We discussed again trying to wean off of his tube feeds.  He will be seeing Dr. Gaylyn Rong in April, Dr. [facility] April 4th, and I will plan on seeing him back in June.  He knows to contact me with any questions or concerns.    ______________________________ Eric Dorsey, M.D. SW/MEDQ  D:  02/27/2012  T:  02/27/2012  Job:  [id number]

## 2012-02-27 NOTE — Progress Notes (Signed)
Pt cont to use peg tube as well as eating soft foods. 6 cans Jevity per peg tube daily. Denies swallowing issues. Has seen speech pathologist for exercises.  Pt states he had 2nd procedure by Dr Lazarus Salines in Feb, [facility] hospital. No records found in [place]. He states dr took "skin from his throat and checked it. It was okay." Pt prescribed unknown med by Dr Lazarus Salines; he states it is for his saliva. He states it is helpful.

## 2012-03-19 NOTE — Progress Notes (Signed)
Received progress notes from Dr. Karol Wolicki @ [facility]; forwarded to Dr. Ha. 

## 2012-04-03 ENCOUNTER — Telehealth: Payer: Self-pay | Admitting: Oncology

## 2012-04-03 ENCOUNTER — Inpatient Hospital Stay (HOSPITAL_COMMUNITY): Admission: RE | Admit: 2012-04-03 | Payer: Medicaid Other | Source: Ambulatory Visit

## 2012-04-03 ENCOUNTER — Other Ambulatory Visit: Payer: Medicaid Other | Admitting: Lab

## 2012-04-03 NOTE — Telephone Encounter (Signed)
Per steve @ ct pt was no show for ct today. Ct r/s for 4/22 by pt/steve. Lb scheduled for 8:15 am after ct and pt will [facility] appt for 4/22 w/[facility] @ 10:30 am. S/w pt he is aware.

## 2012-04-06 ENCOUNTER — Ambulatory Visit: Payer: Medicaid Other | Admitting: Nutrition

## 2012-04-06 ENCOUNTER — Ambulatory Visit (HOSPITAL_BASED_OUTPATIENT_CLINIC_OR_DEPARTMENT_OTHER): Payer: Medicaid Other | Admitting: Oncology

## 2012-04-06 ENCOUNTER — Encounter (HOSPITAL_COMMUNITY): Payer: Self-pay

## 2012-04-06 ENCOUNTER — Ambulatory Visit (HOSPITAL_COMMUNITY)
Admission: RE | Admit: 2012-04-06 | Discharge: 2012-04-06 | Disposition: A | Discharge status: Home / Self Care | Payer: Medicaid Other | Source: Ambulatory Visit | Attending: Oncology | Admitting: Oncology

## 2012-04-06 ENCOUNTER — Telehealth: Payer: Self-pay | Admitting: Oncology

## 2012-04-06 ENCOUNTER — Other Ambulatory Visit: Payer: Medicaid Other

## 2012-04-06 VITALS — BP 122/79 | HR 88 | Temp 97.5°F | Ht 68.0 in | Wt 132.5 lb

## 2012-04-06 DIAGNOSIS — Y842 Radiological procedure and radiotherapy as the cause of abnormal reaction of the patient, or of later complication, without mention of misadventure at the time of the procedure: Secondary | ICD-10-CM | POA: Insufficient documentation

## 2012-04-06 DIAGNOSIS — C321 Malignant neoplasm of supraglottis: Secondary | ICD-10-CM

## 2012-04-06 DIAGNOSIS — Z923 Personal history of irradiation: Secondary | ICD-10-CM | POA: Insufficient documentation

## 2012-04-06 DIAGNOSIS — D491 Neoplasm of unspecified behavior of respiratory system: Secondary | ICD-10-CM

## 2012-04-06 DIAGNOSIS — J701 Chronic and other pulmonary manifestations due to radiation: Secondary | ICD-10-CM | POA: Insufficient documentation

## 2012-04-06 DIAGNOSIS — Z09 Encounter for follow-up examination after completed treatment for conditions other than malignant neoplasm: Secondary | ICD-10-CM | POA: Insufficient documentation

## 2012-04-06 DIAGNOSIS — Z9221 Personal history of antineoplastic chemotherapy: Secondary | ICD-10-CM | POA: Insufficient documentation

## 2012-04-06 DIAGNOSIS — E441 Mild protein-calorie malnutrition: Secondary | ICD-10-CM

## 2012-04-06 DIAGNOSIS — F17200 Nicotine dependence, unspecified, uncomplicated: Secondary | ICD-10-CM

## 2012-04-06 LAB — CBC WITH DIFFERENTIAL/PLATELET
BASO%: 0.4 % (ref 0.0–2.0)
Basophils Absolute: 0 10*3/uL (ref 0.0–0.1)
EOS%: 0.4 % (ref 0.0–7.0)
HCT: 36.6 % — ABNORMAL LOW (ref 38.4–49.9)
HGB: 12.3 g/dL — ABNORMAL LOW (ref 13.0–17.1)
LYMPH%: 14.7 % (ref 14.0–49.0)
MCH: 33.8 pg — ABNORMAL HIGH (ref 27.2–33.4)
MCHC: 33.7 g/dL (ref 32.0–36.0)
MCV: 100.3 fL — ABNORMAL HIGH (ref 79.3–98.0)
NEUT%: 74.6 % (ref 39.0–75.0)
Platelets: 276 10*3/uL (ref 140–400)
lymph#: 1 10*3/uL (ref 0.9–3.3)

## 2012-04-06 MED ORDER — IOHEXOL 300 MG/ML  SOLN
100.0000 mL | Freq: Once | INTRAMUSCULAR | Status: AC | PRN
  Administered 2012-04-06: 100 mL via INTRAVENOUS

## 2012-04-06 NOTE — Telephone Encounter (Signed)
Gv pt appt for oct2013.  scheduled ct scan for 10/22 @ [facility] 
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
## 2012-04-06 NOTE — Patient Instructions (Signed)
A.  CT neck 04/06/2012:  Findings: There are no pathologically enlarged lymph nodes on  either side of the neck. Both parotid glands are normal. The  submandibular gland show atrophy but no evidence of mass. The  thyroid gland is normal. Arterial and venous structures are  patent. There is atherosclerotic disease of the carotid  bifurcation on the left but no measurable stenosis. Ordinary mild  degenerative changes effect the spine.  There is chronic thickening of the aryepiglottic fold on the left.  There is chronic soft tissue prominence of the supraglottic soft  tissues directly above the arytenoid cartilages. This is unchanged  from the previous study measuring 14 x 18 by 20 mm.  Fibrosis at the lung apices related to previous radiation therapy.  No evidence of mass in the upper lungs. Superior mediastinum is  negative.  IMPRESSION:   Similar appearance to the study of 12/04/2011. Chronic thickening  of the left aryepiglottic fold. Chronic prominence of soft tissue  immediately cephalad to the arytenoid cartilages. These findings  may represent scarring/sequela of previous treatment. Mucosal or  submucosal active disease is not excluded. Absence of gross  progression on CT argues against that however. Direct inspection  or PET scan could give more information. No evidence of  lymphadenopathy or other new findings.  B.  Follow up: - With Dr. Conley Simmonds at least twice a year. - Repeat neck CT around 09/2012.  You will see Belenda Cruise, my Nurse Practitioner the day after CT scan. - Continue to work with Vernell Leep to transition away from PEG tube.   - Once you're not using the PEG tube for 3-4 weeks with stable weight, please call us to be referred to get it out.

## 2012-04-06 NOTE — Progress Notes (Signed)
Eric Dorsey returns for nutrition followup.  Weight is down at 132.5 pounds from 138.4 pounds on March 14th.  The patient reports that he is using approximately 6 cans of Jevity 1.2, via his feeding tube throughout the day.  He does reduce that to 4 cans a day if he has time to eat more.  He really tolerates most oral foods other than bread.  He does admit to using his feeding tube rather than eating because he feels like it is quicker.  Patient understands it is important for him to increase his oral intake to come off tube feeding so that his tube can be removed.  NUTRITION DIAGNOSIS:  Inadequate oral intake does continue.  INTERVENTION:  I have asked the patient to switch from Jevity 1.2, via tube to Ensure plus or Boost Plus or a store brand of nutritional supplement in place of the tube feeding.  He will drink these by mouth. He does report that finances can be an issue for him so I have provided him with coupons for these products to help decrease the cost of these. I have reviewed foods he should add to his diet and ways to increase calories of the foods he is already tolerating and I have worked through some strategies with him today to take some foods to work with him that he should be able to drink and to eat with ease.  MONITORING/EVALUATION (GOALS):  The patient has tolerated an increase in the variety of oral foods he tolerates however, he continues to use his tube for nutrition support.  He will work to increase his oral intake of nutritional supplements and food and decrease tube feeding.  NEXT VISIT:  Scheduled for Tuesday, May 21st, to evaluate success of oral diet.    ______________________________ Zenovia Jarred, RD, LDN Clinical Nutrition Specialist BN/MEDQ  D:  04/06/2012  T:  04/06/2012  Job:  [id number]

## 2012-05-05 ENCOUNTER — Encounter: Payer: Medicaid Other | Admitting: Nutrition

## 2012-05-21 ENCOUNTER — Ambulatory Visit: Payer: Medicaid Other | Admitting: Radiation Oncology

## 2012-07-09 ENCOUNTER — Encounter: Payer: Self-pay | Admitting: Radiation Oncology

## 2012-07-09 ENCOUNTER — Ambulatory Visit
Admission: RE | Admit: 2012-07-09 | Discharge: 2012-07-09 | Disposition: A | Discharge status: Home / Self Care | Payer: Medicaid Other | Source: Ambulatory Visit | Attending: Radiation Oncology | Admitting: Radiation Oncology

## 2012-07-09 VITALS — BP 120/79 | HR 90 | Temp 97.8°F | Resp 20 | Wt 132.3 lb

## 2012-07-09 DIAGNOSIS — C321 Malignant neoplasm of supraglottis: Secondary | ICD-10-CM | POA: Insufficient documentation

## 2012-07-09 MED ORDER — LARYNGOSCOPY SOLUTION RAD-ONC
15.0000 mL | Freq: Once | TOPICAL | Status: AC
  Administered 2012-07-09: 15 mL via TOPICAL
  Filled 2012-07-09: qty 15

## 2012-07-09 NOTE — Addendum Note (Signed)
Encounter addended by: Wilmer Floor, PHARMD on: 07/09/2012 10:07 AM<BR>     Documentation filed: Rx Order Verification

## 2012-07-09 NOTE — Addendum Note (Signed)
Encounter addended by: Glennie Hawk, RN on: 07/09/2012  9:57 AM<BR>     Documentation filed: Charges VN, Inpatient MAR, Orders

## 2012-07-09 NOTE — Progress Notes (Signed)
Pt cont to take 4 cans nutrition per peg tube daily in addition to eating regular foods po. Able to eat shredded, cut meats. Pt missed 05/05/12 appt w/nutritionist. Pt denies pain, has intermittent fatigue and maybe loss of appetite. He states hot weather may contribute to appetite loss. Dry mouth, sips water regularly.

## 2012-07-09 NOTE — Addendum Note (Signed)
Encounter addended by: Glennie Hawk, RN on: 07/09/2012  9:54 AM<BR>     Documentation filed: Charges VN

## 2012-07-09 NOTE — Progress Notes (Signed)
   Department of Radiation Oncology  Phone:  [phone] Fax:        [phone]   Name: Eric Dorsey   DOB: [birth year]  MRN: [id number]    Date: 07/09/2012  Follow Up Visit Note  Diagnosis: Locally advanced supraglottic larynx cancer  Interval since last radiation: 18 months  Interval History: Eric Dorsey presents today for routine followup.  Pt cont to take 4 cans nutrition per peg tube daily in addition to eating regular foods po. Able to eat shredded, cut meats. Pt missed 05/05/12 appt w/nutritionist.  Pt denies pain, has intermittent fatigue and maybe loss of appetite. He states hot weather may contribute to appetite loss. Dry mouth, sips water regularly. Working in and around his yard. He is a difficulty swallowing a sandwich as her bread but can swallow most other things without problem. He states he doesn't drink more because he has no appetite. He has no scheduled followup with ENT. He has scheduled followup with Dr. Gaylyn Rong in October. He had a CT scan in April which showed no evidence of tumor recurrence.  Allergies: No Known Allergies  Medications:  Current Outpatient Prescriptions  Medication Sig Dispense Refill  . omeprazole (PRILOSEC) 40 MG capsule Take 40 mg by mouth daily.          Physical Exam:  Is no palpable cervical subclavicular or submandibular adenopathy. He has no palpable or visible signs of tumor recurrence in the neck.  Procedure note: After verbal informed consent I anesthetized the left nares. I then entered the posterior the nasal cavity and posterior oropharynx with the flexible scope. There is still some fullness in the left area epiglottic fold but no evidence of tumor recurrence. The tissue stained and well-healed. The vocal cords approximated at midline.  IMPRESSION: Eric Dorsey is a 56 y.o. male status post chemoradiation for locally advanced supraglottic larynx cancer with no evidence of disease  PLAN:  Mr. Gladu looks great. I encouraged him to  decrease his tube feedings. We discussed substituting one of the tube feedings with a boost and Ensure protein shake. I set a goal for him to try to get that to remove the next time he sees Dr. Velna Ochs. We talked about the long-term complications of tube feedings. I will plan on seeing him back in January which will be 2 years out from treatment. At that point he can probably just be followed by either Dr. Gaylyn Rong or I once year.    Lurline Hare, MD

## 2012-10-05 ENCOUNTER — Telehealth: Payer: Self-pay | Admitting: *Deleted

## 2012-10-05 NOTE — Telephone Encounter (Signed)
Pt called to confirm his appt for lab/ CT tomorrow and to see Belenda Cruise and Vernell Leep on Wednesday.

## 2012-10-06 ENCOUNTER — Other Ambulatory Visit (HOSPITAL_BASED_OUTPATIENT_CLINIC_OR_DEPARTMENT_OTHER): Payer: Medicaid Other | Admitting: Lab

## 2012-10-06 ENCOUNTER — Ambulatory Visit (HOSPITAL_COMMUNITY)
Admission: RE | Admit: 2012-10-06 | Discharge: 2012-10-06 | Disposition: A | Discharge status: Home / Self Care | Payer: Medicaid Other | Source: Ambulatory Visit | Attending: Oncology | Admitting: Oncology

## 2012-10-06 DIAGNOSIS — E860 Dehydration: Secondary | ICD-10-CM

## 2012-10-06 DIAGNOSIS — C321 Malignant neoplasm of supraglottis: Secondary | ICD-10-CM

## 2012-10-06 LAB — CBC WITH DIFFERENTIAL/PLATELET
Basophils Absolute: 0 10*3/uL (ref 0.0–0.1)
EOS%: 1.2 % (ref 0.0–7.0)
Eosinophils Absolute: 0 10*3/uL (ref 0.0–0.5)
LYMPH%: 27.9 % (ref 14.0–49.0)
MCH: 36.2 pg — ABNORMAL HIGH (ref 27.2–33.4)
MCV: 102.7 fL — ABNORMAL HIGH (ref 79.3–98.0)
MONO%: 11.1 % (ref 0.0–14.0)
NEUT#: 2.3 10*3/uL (ref 1.5–6.5)
Platelets: 317 10*3/uL (ref 140–400)
RBC: 3.36 10*6/uL — ABNORMAL LOW (ref 4.20–5.82)
RDW: 16.2 % — ABNORMAL HIGH (ref 11.0–14.6)

## 2012-10-06 LAB — TSH: TSH: 3.317 u[IU]/mL (ref 0.350–4.500)

## 2012-10-06 LAB — COMPREHENSIVE METABOLIC PANEL (CC13)
Alkaline Phosphatase: 70 U/L (ref 40–150)
CO2: 25 mEq/L (ref 22–29)
Creatinine: 1 mg/dL (ref 0.7–1.3)
Glucose: 91 mg/dl (ref 70–99)
Total Bilirubin: 0.4 mg/dL (ref 0.20–1.20)

## 2012-10-06 MED ORDER — IOHEXOL 300 MG/ML  SOLN
100.0000 mL | Freq: Once | INTRAMUSCULAR | Status: AC | PRN
  Administered 2012-10-06: 100 mL via INTRAVENOUS

## 2012-10-07 ENCOUNTER — Ambulatory Visit: Payer: Medicaid Other | Admitting: Oncology

## 2012-10-07 ENCOUNTER — Telehealth: Payer: Self-pay | Admitting: Oncology

## 2012-10-07 ENCOUNTER — Encounter: Payer: Medicaid Other | Admitting: Nutrition

## 2012-10-07 NOTE — Telephone Encounter (Signed)
pt could not make appt today and need to reschedule.  Gave pt new d.t.

## 2012-10-09 ENCOUNTER — Encounter: Payer: Medicaid Other | Admitting: Oncology

## 2012-10-09 ENCOUNTER — Telehealth: Payer: Self-pay | Admitting: Oncology

## 2012-10-09 NOTE — Progress Notes (Signed)
This encounter was created in error - please disregard.

## 2012-10-09 NOTE — Telephone Encounter (Signed)
gv and printed appt schedule for pt for OCt and NOV...the patient missed appt and per pof rescheduled

## 2012-10-13 ENCOUNTER — Ambulatory Visit: Payer: Medicaid Other | Admitting: Nutrition

## 2012-10-13 NOTE — Progress Notes (Signed)
Eric Dorsey presents to nutrition followup. He states he is able to tolerate a good variety of foods including pizza,. apples and hotdogs. He continues to utilize Jevity 1.2- 4 cans daily via feeding tube. He reports that he isn't really hungry so he is not reminded to snack more often. His weight has decreased to 127.4 pounds today from 132.5 pounds in April.  Nutrition diagnosis: Inadequate oral intake continues.  Intervention: I have reviewed strategies for increasing overall oral intake throughout the day. I have encouraged patient that he will need to set reminders to eat since he does not feel hungry throughout the day. I've again educated him on higher calorie foods. I have provided him with a list of foods that will be easy for him to prepare. I've encouraged him to freeze leftovers in small portions so that he can have these things easily available to him. I have provided him with Ensure Plus to take with him today to supplement his food intake. Patient able to teach back strategies for increasing overall intake.   Monitoring, evaluation, goals: The patient will increase his oral intake so that tube feedings can be decreased and discontinued.  Next visit: Patient will contact me for questions or concerns.

## 2012-10-16 ENCOUNTER — Ambulatory Visit (HOSPITAL_BASED_OUTPATIENT_CLINIC_OR_DEPARTMENT_OTHER): Payer: Medicaid Other | Admitting: Oncology

## 2012-10-16 ENCOUNTER — Telehealth: Payer: Self-pay | Admitting: Oncology

## 2012-10-16 ENCOUNTER — Encounter: Payer: Self-pay | Admitting: Oncology

## 2012-10-16 VITALS — BP 112/71 | HR 83 | Temp 97.7°F | Resp 20 | Ht 68.0 in | Wt 130.4 lb

## 2012-10-16 DIAGNOSIS — C321 Malignant neoplasm of supraglottis: Secondary | ICD-10-CM

## 2012-10-16 DIAGNOSIS — E46 Unspecified protein-calorie malnutrition: Secondary | ICD-10-CM

## 2012-10-16 NOTE — Telephone Encounter (Signed)
appts made and printed for pt ,pt to see dr Cloria Spring 11/21 3:20
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
## 2012-11-06 NOTE — Progress Notes (Signed)
Received office notes from Dr. Karol Wolicki @ [facility]; forwarded to Dr. Ha. 

## 2012-12-17 ENCOUNTER — Ambulatory Visit: Payer: Medicaid Other | Admitting: Radiation Oncology

## 2012-12-18 ENCOUNTER — Ambulatory Visit: Payer: Medicaid Other | Attending: Radiation Oncology | Admitting: Radiation Oncology

## 2012-12-24 ENCOUNTER — Encounter: Payer: Self-pay | Admitting: Radiation Oncology

## 2012-12-24 ENCOUNTER — Ambulatory Visit
Admission: RE | Admit: 2012-12-24 | Discharge: 2012-12-24 | Disposition: A | Discharge status: Home / Self Care | Payer: Medicaid Other | Source: Ambulatory Visit | Attending: Radiation Oncology | Admitting: Radiation Oncology

## 2012-12-24 VITALS — BP 130/74 | HR 96 | Temp 98.5°F | Resp 20 | Wt 136.7 lb

## 2012-12-24 DIAGNOSIS — C321 Malignant neoplasm of supraglottis: Secondary | ICD-10-CM

## 2012-12-24 DIAGNOSIS — K219 Gastro-esophageal reflux disease without esophagitis: Secondary | ICD-10-CM | POA: Insufficient documentation

## 2012-12-24 DIAGNOSIS — Z923 Personal history of irradiation: Secondary | ICD-10-CM | POA: Insufficient documentation

## 2012-12-24 DIAGNOSIS — J069 Acute upper respiratory infection, unspecified: Secondary | ICD-10-CM | POA: Insufficient documentation

## 2012-12-24 HISTORY — DX: Personal history of irradiation: Z92.3

## 2012-12-24 MED ORDER — PILOCARPINE HCL 5 MG PO TABS: 5.0000 mg | ORAL_TABLET | Freq: Four times a day (QID) | ORAL | Status: DC

## 2012-12-24 NOTE — Progress Notes (Signed)
Eric Dorsey here follow up s/p rad tx supraglottic/larynx on 11/15/10-01/04/11 70Gy/35 fractions Alert,oriented x3, 100% room air sats, vitals wnl, sometimes takes too big bite of food and gets stuck" states patient, no c/op pain, out of his salagen tabs 1 month, has dry mouth still, taste with first few bites, but then the taste goes away ",had  Ate 2 boied eggs and bacon,coffee tis morning, takes water behind eating, uses 2 cans ensure and 4 cans jevity via peg tube dialy, can get toast down fairly well states patient Wants peg tube out 3:58 PM

## 2012-12-24 NOTE — Progress Notes (Signed)
   Department of Radiation Oncology  Phone:  [phone] Fax:        [phone]   Name: SIMRAN MANNIS   DOB: [birth year]  MRN: [id number]    Date: 12/24/2012  Follow Up Visit Note  Diagnosis: Locally advanced supraglottic larynx cancer  Interval since last radiation: 2 years  Interval History: Eric Dorsey presents today for routine followup.  He is feeling well and doing well. He had a CT of the neck in October which showed no evidence of recurrent disease the thickening of the area epiglottic fold was stable. He unfortunately continues to be tube feed independent. He is using 4 cans of Jevity in 2 cans of Ensure through his tube each day. He is eating things like eggs bacon and coffee. He can sometimes the test. His main issue is that he lose his taste is in his foods such as his mouth. He also continues to have difficulties with dry mouth and things sticking to his partial dentures. He is not working because he has to do so much regarding his PEG tube. He is scheduled to see ENT tomorrow. He has no pain related to his primary site.  Allergies: No Known Allergies  Medications:  Current Outpatient Prescriptions  Medication Sig Dispense Refill  . omeprazole (PRILOSEC) 40 MG capsule Take 40 mg by mouth daily.        . pilocarpine (SALAGEN) 5 MG tablet Take 1 tablet (5 mg total) by mouth QID.  120 tablet  3    Physical Exam:   weight is 136 lb 11.8 oz (62.025 kg). His oral temperature is 98.5 F (36.9 C). His blood pressure is 130/74 and his pulse is 96. His respiration is 20 and oxygen saturation is 100%.  He is a pleasant male in no distress sitting comfortably examining table. He has no palpable cervical submandibular or supraclavicular adenopathy. Is alert and oriented x3. I have defferred laryngoscopy on this patient as he is seeing his ENT tomorrow  Wt Readings from Last 3 Encounters:  12/24/12 136 lb 11.8 oz (62.025 kg)  10/16/12 130 lb 6.4 oz (59.149 kg)  10/13/12 127 lb 6.4 oz  (57.788 kg)     IMPRESSION: Hershal is a 57 y.o. male status post chemoradiation for locally advanced supraglottic larynx cancer with no clinical evidence of disease  PLAN:  Travoris looks good. He still can struggles to maintain his weight without using his tube. We discussed strategies to increase his calorie intake by mouth and not use his tube. I told him he had to be at least 2 weeks without using his tube before we can remove it. He agreed to increase his by mouth intake and is clearly doing the right thing because his weight has increased so significantly, hopefully we can get that tube out. I will plan on seeing him back in September October. He sees Dr. Gaylyn Rong in may.    Lurline Hare, MD

## 2012-12-25 ENCOUNTER — Ambulatory Visit: Payer: Medicaid Other | Admitting: Radiation Oncology

## 2013-01-01 ENCOUNTER — Telehealth: Payer: Self-pay | Admitting: *Deleted

## 2013-01-01 NOTE — Telephone Encounter (Signed)
Called pt to FU on his ENT appt and peg tube use. Pt states he went to Dr Burley Saver office and was told his appt is in Feb. Pt states he is eating foods but continues to take in 4 cans nutrition per peg tube daily. Pt states he is trying to increase food intake but does not think he has gained any weight.  Informed pt will call him again in 1 - 1 1/2 wks to check on his progress. Pt verbalized understanding.

## 2013-01-07 ENCOUNTER — Telehealth: Payer: Self-pay | Admitting: *Deleted

## 2013-01-07 NOTE — Telephone Encounter (Signed)
Called pt to FU peg tube use. Pt states he now weighs 140 lb, was 136.7 lb on 12/24/12. Pt states he continues to put 4 cans Jevity in peg tube daily, drinks 2 Ensures/day, eats soft foods. Pt inquiring about how much he has to eat before getting peg tube removed. Informed pt he must be eating, drinking entirely by mouth x 2 weeks, only flushing peg tube w/water, and must maintain or gain weight. Informed pt will refer him back to Vernell Leep, nutritionist for FU; pt agreed. Left vm for B Neff re: above.

## 2013-01-11 ENCOUNTER — Telehealth: Payer: Self-pay | Admitting: Nutrition

## 2013-01-11 NOTE — Telephone Encounter (Signed)
I received a message from nursing to please contact patient by telephone. I did call him however, I was unable to leave a message or speak with him. His phone did not have the ability to take a message. I will continue to try to contact patient to assist with diet advancement and discontinuation of tube feedings.

## 2013-02-03 ENCOUNTER — Telehealth: Payer: Self-pay | Admitting: *Deleted

## 2013-02-03 NOTE — Telephone Encounter (Signed)
Called pt to FU on peg tube use. He states he is trying to use the Jevity he has on hand, putting 2 cans in his peg tube daily. Otherwise he is eating soft foods. Pt states he does not believe he has gained weight but doesn't think he has lost weight either. Pt states he has appt w/Dr [facility] tomorrow.

## 2013-02-08 ENCOUNTER — Telehealth: Payer: Self-pay | Admitting: Nutrition

## 2013-02-08 NOTE — Telephone Encounter (Signed)
I spoke with patient on the telephone regarding tube feedings. Patient reports he is only using Jevity 1.2 twice daily via his feeding tube. He is drinking at least 2 Ensure Plus a day plus eating meals. He continues to consume a variety of foods. He thinks his weight is stable. He denies other nutritional problems at this time. He verbalizes a desire to DC his feeding tube.  Nutrition diagnosis: Inadequate oral intake improved.  Intervention: I have educated patient to increase Ensure Plus by mouth to 4 times a day and wean off Jevity 1.2 via feeding tube. Patient reports that he feels like he can do this without difficulty. He understands his goal is weight stabilization.  Monitoring, evaluation, goals: Patient will increase oral intake at mealtimes plus Ensure Plus 4 times a day and discontinue tube feedings. He will strive for weight maintenance during this process.  Next visit: Patient agrees to contact me with further questions or concerns.

## 2013-02-26 ENCOUNTER — Telehealth: Payer: Self-pay | Admitting: *Deleted

## 2013-02-26 NOTE — Telephone Encounter (Signed)
Spoke w/pt to FU on peg tube use. He states he eats breakfast, lunch, night time snack and uses feeding tube between twice a day. He denies pain, difficulty chewing, swallowing, states he can eat whatever he wishes for the most part. Pt unsure of his weight but states he feels it "goes up and then down a little". Enc pt to continue eating as much as possible, decrease use of tube. Pt verbalized understanding.

## 2013-04-13 ENCOUNTER — Ambulatory Visit (HOSPITAL_COMMUNITY): Payer: Medicaid Other

## 2013-04-13 ENCOUNTER — Other Ambulatory Visit: Payer: Medicaid Other

## 2013-04-14 ENCOUNTER — Telehealth: Payer: Self-pay | Admitting: Oncology

## 2013-04-14 ENCOUNTER — Other Ambulatory Visit: Payer: Self-pay | Admitting: Oncology

## 2013-04-14 ENCOUNTER — Telehealth: Payer: Self-pay | Admitting: *Deleted

## 2013-04-14 NOTE — Telephone Encounter (Signed)
Called pt to inform him of Office visit on 5/02, even though CT scan could not be done. To arrive at 10 am as he will need lab also.  He verbalized understanding.

## 2013-04-14 NOTE — Telephone Encounter (Signed)
Message copied by Wende Mott on Wed Apr 14, 2013  4:17 PM ------      Message from: HA, Raliegh Ip T      Created: Wed Apr 14, 2013 11:15 AM      Regarding: OK to follow up with me on 5/2 despite no CT       Please call pt and inform him that his insurance declined CT scan.  I can still see him without CT.  Thanks.   ------

## 2013-04-14 NOTE — Telephone Encounter (Signed)
added lab.Marland KitchenMarland Kitchenper 5.2.14 pof

## 2013-04-16 ENCOUNTER — Ambulatory Visit: Payer: Medicaid Other | Admitting: Oncology

## 2013-04-16 ENCOUNTER — Other Ambulatory Visit: Payer: Medicaid Other | Admitting: Lab

## 2013-08-19 ENCOUNTER — Encounter: Payer: Self-pay | Admitting: Radiation Oncology

## 2013-08-19 ENCOUNTER — Telehealth: Payer: Self-pay | Admitting: *Deleted

## 2013-08-19 ENCOUNTER — Ambulatory Visit
Admission: RE | Admit: 2013-08-19 | Discharge: 2013-08-19 | Disposition: A | Discharge status: Home / Self Care | Payer: Medicaid Other | Source: Ambulatory Visit | Attending: Radiation Oncology | Admitting: Radiation Oncology

## 2013-08-19 VITALS — BP 101/61 | HR 89 | Temp 98.3°F | Resp 20 | Wt 134.9 lb

## 2013-08-19 DIAGNOSIS — Z931 Gastrostomy status: Secondary | ICD-10-CM | POA: Insufficient documentation

## 2013-08-19 DIAGNOSIS — C321 Malignant neoplasm of supraglottis: Secondary | ICD-10-CM | POA: Insufficient documentation

## 2013-08-19 DIAGNOSIS — Z9221 Personal history of antineoplastic chemotherapy: Secondary | ICD-10-CM | POA: Insufficient documentation

## 2013-08-19 DIAGNOSIS — Z923 Personal history of irradiation: Secondary | ICD-10-CM | POA: Insufficient documentation

## 2013-08-19 DIAGNOSIS — J392 Other diseases of pharynx: Secondary | ICD-10-CM | POA: Insufficient documentation

## 2013-08-19 MED ORDER — LARYNGOSCOPY SOLUTION RAD-ONC
15.0000 mL | Freq: Once | TOPICAL | Status: AC
  Administered 2013-08-19: 15 mL via TOPICAL
  Filled 2013-08-19: qty 15

## 2013-08-19 NOTE — Progress Notes (Signed)
F/u  S/p rad txs supraglottic larynx ca,  Completed 2012,  Still taking jevity 1.5 cl can 4x day via peg, eating most foods, no c/o nausea, swallows too fast at times and food gets stuck, last visit with Dr.Wolicki 02/08/13, last TSH 3.37 10/06/12, bacon/eggs,or oatmeal for breakfast, had spaghetti yesterday dinner, went down good 1:23 PM

## 2013-08-19 NOTE — Telephone Encounter (Signed)
Called Interventional Radiology Spoke with Inetta Fermo, can replace peg tube tomorrow ,patient to be here at 2pm, for 230pm procedure, NPO 6 hours  Before 2pm, nothing to eat,drink, or via peg, pat8ent given instructions, will be here for change og g-tuube, f/u 1 month with Dr.Wentworth card given  2:55 PM

## 2013-08-20 ENCOUNTER — Other Ambulatory Visit: Payer: Self-pay | Admitting: Radiation Oncology

## 2013-08-20 ENCOUNTER — Ambulatory Visit (HOSPITAL_COMMUNITY)
Admission: RE | Admit: 2013-08-20 | Discharge: 2013-08-20 | Disposition: A | Discharge status: Home / Self Care | Payer: Medicaid Other | Source: Ambulatory Visit | Attending: Radiation Oncology | Admitting: Radiation Oncology

## 2013-08-20 DIAGNOSIS — Y833 Surgical operation with formation of external stoma as the cause of abnormal reaction of the patient, or of later complication, without mention of misadventure at the time of the procedure: Secondary | ICD-10-CM | POA: Insufficient documentation

## 2013-08-20 DIAGNOSIS — C321 Malignant neoplasm of supraglottis: Secondary | ICD-10-CM

## 2013-08-20 DIAGNOSIS — K9423 Gastrostomy malfunction: Secondary | ICD-10-CM | POA: Insufficient documentation

## 2013-08-20 MED ORDER — IOHEXOL 300 MG/ML  SOLN
10.0000 mL | Freq: Once | INTRAMUSCULAR | Status: AC | PRN
  Administered 2013-08-20: 10 mL

## 2013-08-20 MED ORDER — LIDOCAINE VISCOUS 2 % MT SOLN
15.0000 mL | Freq: Once | OROMUCOSAL | Status: AC
  Administered 2013-08-20: 15 mL via OROMUCOSAL
  Filled 2013-08-20: qty 15

## 2013-08-20 NOTE — Procedures (Signed)
Successful exchange of existing 20 french pull thru gastrostomy tube for new 20 french balloon retention tube.Fluoroscopy confirms appropriate placement in stomach. During removal of pull thru tube retention bumper was detached from tubing and remained in stomach. Pt was told to contact our service for any persistent abd pain,N/V and also to inform IR once bumper passes in stool. No immediate complications.

## 2013-08-20 NOTE — Progress Notes (Signed)
   Department of Radiation Oncology  Phone:  [phone] Fax:        [phone]   Name: Eric Dorsey MRN: [id number]  DOB: 1956-10-19  Date: 08/19/2013  Follow Up Visit Note  Diagnosis: Locally advanced laryngeal cancer  Summary and Interval since last radiation: Chemoradiation completed 01/04/2011  Interval History: Jaymian presents today for routine followup.  He is now 2 and half years out from treatment. He unfortunately continues to use 4 cans of Jevity per day in his tube. He is concerned about the appearance of his tube. He has had and since pretreatment and is worried that this needs to be replaced. He reports no pain. He sometimes has difficulties if he tries to swallow to faster too much 1 time. He is on disability and is not working. He saw Dr. Lazarus Salines in February. We performed a TSH in October which was normal.  Allergies: No Known Allergies  Medications:  Current Outpatient Prescriptions  Medication Sig Dispense Refill  . Nutritional Supplements (FEEDING SUPPLEMENT, JEVITY 1.5 CAL,) LIQD Place 237 mLs into feeding tube 4 (four) times daily. 4 cans via peg      . omeprazole (PRILOSEC) 40 MG capsule Take 40 mg by mouth daily.        . pilocarpine (SALAGEN) 5 MG tablet Take 1 tablet (5 mg total) by mouth QID.  120 tablet  3   No current facility-administered medications for this encounter.    Physical Exam:  Filed Vitals:   08/19/13 1320  BP: 101/61  Pulse: 89  Temp: 98.3 F (36.8 C)  Resp: 20   he is a pleasant male in no distress sitting comfortable on examining table. His weight is stable. He has no palpable submandibular cervical or supraclavicular adenopathy. Examination of his oral cavity and posterior oropharynx shows no thrush or lesions. After administration of lidocaine in the right nares the right nasal cavity was intubated with the flexible laryngoscopic. The vocal cords approximated at midline. The epiglottis and supraglottic larynx were normal. Along  the right lateral pharyngeal wall there was a small ulcerated area which was white. This was small measuring less than 3 mm. The rest of the larynx hypopharynx and oropharynx was examined and was normal. His tube has evidence of dry rot and mold.  IMPRESSION: Damico is a 57 y.o. male status post concurrent chemoradiation with a small area of ulceration on the right.  PLAN:  Assess her Vanduyne return in a month for repeat examination. He could have just scrape the edge of his pharynx with food. He's not having any pain. I would like to make sure this area resolves. We've also set him up with interventional radiology to have his tube replaced.    Lurline Hare, MD

## 2013-09-24 ENCOUNTER — Ambulatory Visit
Admission: RE | Admit: 2013-09-24 | Discharge: 2013-09-24 | Disposition: A | Discharge status: Home / Self Care | Payer: Medicaid Other | Source: Ambulatory Visit | Attending: Radiation Oncology | Admitting: Radiation Oncology

## 2013-09-24 ENCOUNTER — Encounter: Payer: Self-pay | Admitting: Radiation Oncology

## 2013-09-24 VITALS — BP 108/69 | HR 92 | Temp 98.3°F | Resp 20 | Wt 137.6 lb

## 2013-09-24 DIAGNOSIS — K117 Disturbances of salivary secretion: Secondary | ICD-10-CM | POA: Insufficient documentation

## 2013-09-24 DIAGNOSIS — C329 Malignant neoplasm of larynx, unspecified: Secondary | ICD-10-CM | POA: Insufficient documentation

## 2013-09-24 DIAGNOSIS — C321 Malignant neoplasm of supraglottis: Secondary | ICD-10-CM

## 2013-09-24 MED ORDER — LARYNGOSCOPY SOLUTION RAD-ONC
15.0000 mL | Freq: Once | TOPICAL | Status: AC
  Administered 2013-09-24: 15 mL via TOPICAL
  Filled 2013-09-24: qty 15

## 2013-09-24 NOTE — Progress Notes (Signed)
Pt denies pain, fatigue, loss of appetite. He reports dry mouth. He is eating foods daily, taking in 2 cans Jevity per peg tube daily. He does rest during the day.

## 2013-09-24 NOTE — Addendum Note (Signed)
Encounter addended by: Glennie Hawk, RN on: 09/24/2013  2:44 PM<BR>     Documentation filed: Orders, Inpatient MAR

## 2013-09-24 NOTE — Progress Notes (Signed)
   Department of Radiation Oncology  Phone:  [phone] Fax:        [phone]   Name: Eric Dorsey MRN: [id number]  DOB: 1956/11/24  Date: 09/24/2013  Follow Up Visit Note  Diagnosis: Locally advanced laryngeal cancer  Summary and Interval since last radiation: Chemoradiation completed 01/04/2011  Interval History: Eric Dorsey presents today for routine followup.  Him back in a month to evaluate an abnormality I saw on the right lateral pharyngeal wall. He is still doing 2 cans of Jevity per day. He is working intermittently. He is mostly using his disability. He is swallowing better. He still has dry mouth and is not taking his Salagen. We checked his TSH last in October. He has not had any recent imaging.  Allergies: No Known Allergies  Medications:  Current Outpatient Prescriptions  Medication Sig Dispense Refill  . Nutritional Supplements (FEEDING SUPPLEMENT, JEVITY 1.5 CAL,) LIQD Place 237 mLs into feeding tube 4 (four) times daily. 4 cans via peg      . omeprazole (PRILOSEC) 40 MG capsule Take 40 mg by mouth daily.        . pilocarpine (SALAGEN) 5 MG tablet Take 1 tablet (5 mg total) by mouth QID.  120 tablet  3   No current facility-administered medications for this encounter.    Physical Exam:  Filed Vitals:   09/24/13 1400  BP: 108/69  Pulse: 92  Temp: 98.3 F (36.8 C)  Resp: 20   Wt Readings from Last 3 Encounters:  09/24/13 137 lb 9.6 oz (62.415 kg)  08/19/13 134 lb 14.4 oz (61.19 kg)  12/24/12 136 lb 11.8 oz (62.025 kg)   He is a pleasant male in no distress sitting comfortable on examining table. His weight is stable. He has no palpable submandibular cervical or supraclavicular adenopathy. Examination of his oral cavity and posterior oropharynx shows no thrush or lesions. After administration of lidocaine in the right nares the right nasal cavity was intubated with the flexible laryngoscopic. The vocal cords approximated at midline. The epiglottis and  supraglottic larynx were normal. The abnormality of the right pharyngeal wall was gone.  IMPRESSION: Eric Dorsey is a 57 y.o. male status post concurrent chemoradiation with no evidence of disease  PLAN:  He is looking good. He unfortunately his feeding tube dependence although I believe this is more of a function of convenience rather than any difficulties with swallowing. I will see him back in July or August and have asked him to see Dr. Lazarus Salines in January which will be his 3 year anniversary.  Lurline Hare, MD

## 2013-09-24 NOTE — Addendum Note (Signed)
Encounter addended by: Delynn Flavin, RN on: 09/24/2013  6:34 PM<BR>     Documentation filed: Charges VN

## 2013-09-24 NOTE — Addendum Note (Signed)
Encounter addended by: Carloyn Jaeger, RPH on: 09/24/2013  2:51 PM<BR>     Documentation filed: Rx Order Verification

## 2014-07-27 ENCOUNTER — Telehealth: Payer: Self-pay

## 2014-07-27 NOTE — Telephone Encounter (Signed)
Called for notes from Dr.Wolicki's office.

## 2014-07-29 ENCOUNTER — Ambulatory Visit
Admission: RE | Admit: 2014-07-29 | Discharge: 2014-07-29 | Disposition: A | Discharge status: Home / Self Care | Payer: Medicaid Other | Source: Ambulatory Visit | Attending: Radiation Oncology | Admitting: Radiation Oncology

## 2014-07-29 ENCOUNTER — Ambulatory Visit (HOSPITAL_COMMUNITY)
Admission: RE | Admit: 2014-07-29 | Discharge: 2014-07-29 | Disposition: A | Discharge status: Home / Self Care | Payer: Medicaid Other | Source: Ambulatory Visit | Attending: Radiation Oncology | Admitting: Radiation Oncology

## 2014-07-29 ENCOUNTER — Ambulatory Visit: Payer: Medicaid Other | Admitting: Radiation Oncology

## 2014-07-29 VITALS — BP 137/90 | HR 100 | Temp 98.2°F | Resp 20 | Wt 129.6 lb

## 2014-07-29 DIAGNOSIS — J438 Other emphysema: Secondary | ICD-10-CM | POA: Diagnosis not present

## 2014-07-29 DIAGNOSIS — C321 Malignant neoplasm of supraglottis: Secondary | ICD-10-CM

## 2014-07-29 DIAGNOSIS — C329 Malignant neoplasm of larynx, unspecified: Secondary | ICD-10-CM | POA: Insufficient documentation

## 2014-07-29 DIAGNOSIS — Z79899 Other long term (current) drug therapy: Secondary | ICD-10-CM | POA: Insufficient documentation

## 2014-07-29 DIAGNOSIS — C760 Malignant neoplasm of head, face and neck: Secondary | ICD-10-CM | POA: Diagnosis not present

## 2014-07-29 DIAGNOSIS — Z931 Gastrostomy status: Secondary | ICD-10-CM | POA: Diagnosis not present

## 2014-07-29 MED ORDER — LARYNGOSCOPY SOLUTION RAD-ONC
15.0000 mL | Freq: Once | TOPICAL | Status: AC
  Administered 2014-07-29: 15 mL via TOPICAL
  Filled 2014-07-29: qty 15

## 2014-07-29 NOTE — Progress Notes (Addendum)
Patient denies pain, difficulty eating/swallowing, dry mouth. He does report occasional fatigue, loss of appetite. He continues to use his peg tube taking in 4 cans Jevity. He states some days he puts in more than other days depending on his appetite. He has lost 8 lbs in past 10 months. Patient states he has all taste senses. Pt states he wants to get peg tube removed.  Pt has not seen ENT since feb 2014.

## 2014-07-29 NOTE — Progress Notes (Signed)
   Department of Radiation Oncology  Phone:  [phone] Fax:        [phone]   Name: ABDULMALIK Dorsey MRN: [id number]  DOB: 1956-04-22  Date: 07/29/2014  Follow Up Visit Note  Diagnosis: Locally advanced laryngeal cancer  Summary and Interval since last radiation: Chemoradiation completed 01/04/2011  Interval History: Eric Dorsey presents today for routine followup.  He is doidn gwell. He did not see Dr. Erik Dorsey.  He is still doing 4 cans a day in his tube. He can eat whatever he wants. He has no complaints of sore throat. No recent imaging.   Allergies: No Known Allergies  Medications:  Current Outpatient Prescriptions  Medication Sig Dispense Refill  . Nutritional Supplements (FEEDING SUPPLEMENT, JEVITY 1.5 CAL,) LIQD Place 237 mLs into feeding tube 4 (four) times daily. 4 cans via peg      . omeprazole (PRILOSEC) 40 MG capsule Take 40 mg by mouth daily.        . pilocarpine (SALAGEN) 5 MG tablet Take 1 tablet (5 mg total) by mouth QID.  120 tablet  3   No current facility-administered medications for this encounter.    Physical Exam:  Filed Vitals:   07/29/14 1457  BP: 137/90  Pulse: 100  Temp: 98.2 F (36.8 C)  Resp: 20   Wt Readings from Last 3 Encounters:  07/29/14 129 lb 9.6 oz (58.786 kg)  09/24/13 137 lb 9.6 oz (62.415 kg)  08/19/13 134 lb 14.4 oz (61.19 kg)   He is a pleasant male in no distress sitting comfortable on examining table. His weight is stable. He has no palpable submandibular cervical or supraclavicular adenopathy. Examination of his oral cavity and posterior oropharynx shows no thrush or lesions. Poor dentition. Upper denture plate.   After administration of lidocaine in the right nares the right nasal cavity was intubated with the flexible laryngoscopic. The vocal cords approximated at midline. The epiglottis and supraglottic larynx were normal. I saw some mucus on the lateral right aryepiglottic fold that cleared when he coughed. This area was  normal apearing.    IMPRESSION: Eric Dorsey is a 58 y.o. male status post concurrent chemoradiation with no evidence of disease  PLAN:  He is looking good. He unfortunately his feeding tube dependence although I believe this is more of a function of convenience rather than any difficulties with swallowing. I encouraged him to continue increasing his po intake and gave him samples of ensure.  I will see him back in 6 months. I checked a chest xray and tsh today.   Eric Silversmith, MD

## 2014-08-01 LAB — TSH CHCC: TSH: 8.305 m(IU)/L — ABNORMAL HIGH (ref 0.320–4.118)

## 2014-08-03 ENCOUNTER — Telehealth: Payer: Self-pay

## 2014-08-03 NOTE — Telephone Encounter (Signed)
Left message for patient to call [phone]. Needs PCP to follow for elevated TSH 8.305 for hypothyroidism post radiation.

## 2014-08-10 ENCOUNTER — Other Ambulatory Visit: Payer: Self-pay | Admitting: Radiation Oncology

## 2014-08-10 DIAGNOSIS — C321 Malignant neoplasm of supraglottis: Secondary | ICD-10-CM

## 2014-08-11 ENCOUNTER — Telehealth: Payer: Self-pay | Admitting: *Deleted

## 2014-08-11 NOTE — Telephone Encounter (Signed)
Called patient to inform of appt. With Dr. Loanne Drilling on 08-30-14- arrival time - 1:15 pm- address - [address] ph. No. [phone], lvm for a return call

## 2014-08-23 ENCOUNTER — Telehealth: Payer: Self-pay | Admitting: Dietician

## 2014-08-23 NOTE — Telephone Encounter (Signed)
Brief Outpatient Oncology Nutrition Note  Patient has been identified to be at risk on malnutrition screen.  Wt Readings from Last 10 Encounters:  07/29/14 129 lb 9.6 oz (58.786 kg)  09/24/13 137 lb 9.6 oz (62.415 kg)  08/19/13 134 lb 14.4 oz (61.19 kg)  12/24/12 136 lb 11.8 oz (62.025 kg)  10/16/12 130 lb 6.4 oz (59.149 kg)  10/13/12 127 lb 6.4 oz (57.788 kg)  07/09/12 132 lb 4.8 oz (60.011 kg)  04/06/12 132 lb 8 oz (60.102 kg)  02/27/12 138 lb 6.4 oz (62.778 kg)  01/09/12 133 lb (60.328 kg)    Dx:  Cancer of Supraglottis.  Patient of Dr. Pablo Ledger.  Called patient due to weight loss.  Patient takes 4 cans Jevity 1.5 per peg daily.  This provides 1440 kcal, 60 gm protein daily.  He also eats, some days more than others based on his appetite.  Patient also states that he had been doing too much outside this summer as well and then would not eat as much.  Patient prepares his own meals.  Patient wishes to get rid of the PEG tube.  MD feels that it is more of a functional convenience rather than any difficulty swallowing.  Patient has been given Ensure which he has started using.    Patient stated that he was almost out of Jevity and asked if he should order it.  I would like patient's weight to be back up before he discontinues this completely.  Question if patient can get enough calories in by mouth at this time to gain weight.  Patient states that he has stopped working so much as well.    Will mail patient tips to increase calories and protein and discussed this on the phone.  Will also mail coupons for Ensure and the [place] RD contact information.  Discussed patient with the Ali Molina RD who is available as needed.  Antonieta Iba, RD, LDN

## 2014-08-30 ENCOUNTER — Encounter: Payer: Self-pay | Admitting: Endocrinology

## 2014-08-30 ENCOUNTER — Ambulatory Visit (INDEPENDENT_AMBULATORY_CARE_PROVIDER_SITE_OTHER): Payer: Medicaid Other | Admitting: Endocrinology

## 2014-08-30 VITALS — BP 126/70 | HR 89 | Temp 97.8°F | Ht 68.0 in | Wt 134.0 lb

## 2014-08-30 DIAGNOSIS — E039 Hypothyroidism, unspecified: Secondary | ICD-10-CM

## 2014-08-30 MED ORDER — LEVOTHYROXINE SODIUM 50 MCG PO TABS: 50.0000 ug | ORAL_TABLET | Freq: Every day | ORAL | Status: DC

## 2014-08-30 NOTE — Patient Instructions (Addendum)
i have sent a prescription to your pharmacy, for a thyroid hormone pill. Most, but not all, people continue to need this pill for the rest of their lives.   Please come back for a follow-up appointment in 6 weeks.         Hypothyroidism The thyroid is a large gland located in the lower front of your neck. The thyroid gland helps control metabolism. Metabolism is how your body handles food. It controls metabolism with the hormone thyroxine. When this gland is underactive (hypothyroid), it produces too little hormone.  CAUSES These include:   Absence or destruction of thyroid tissue.  Goiter due to iodine deficiency.  Goiter due to medications.  Congenital defects (since birth).  Problems with the pituitary. This causes a lack of TSH (thyroid stimulating hormone). This hormone tells the thyroid to turn out more hormone. SYMPTOMS  Lethargy (feeling as though you have no energy)  Cold intolerance  Weight gain (in spite of normal food intake)  Dry skin  Coarse hair  Menstrual irregularity (if severe, may lead to infertility)  Slowing of thought processes Cardiac problems are also caused by insufficient amounts of thyroid hormone. Hypothyroidism in the newborn is cretinism, and is an extreme form. It is important that this form be treated adequately and immediately or it will lead rapidly to retarded physical and mental development. DIAGNOSIS  To prove hypothyroidism, your caregiver may do blood tests and ultrasound tests. Sometimes the signs are hidden. It may be necessary for your caregiver to watch this illness with blood tests either before or after diagnosis and treatment. TREATMENT  Low levels of thyroid hormone are increased by using synthetic thyroid hormone. This is a safe, effective treatment. It usually takes about four weeks to gain the full effects of the medication. After you have the full effect of the medication, it will generally take another four weeks for  problems to leave. Your caregiver may start you on low doses. If you have had heart problems the dose may be gradually increased. It is generally not an emergency to get rapidly to normal. HOME CARE INSTRUCTIONS   Take your medications as your caregiver suggests. Let your caregiver know of any medications you are taking or start taking. Your caregiver will help you with dosage schedules.  As your condition improves, your dosage needs may increase. It will be necessary to have continuing blood tests as suggested by your caregiver.  Report all suspected medication side effects to your caregiver. SEEK MEDICAL CARE IF: Seek medical care if you develop:  Sweating.  Tremulousness (tremors).  Anxiety.  Rapid weight loss.  Heat intolerance.  Emotional swings.  Diarrhea.  Weakness. SEEK IMMEDIATE MEDICAL CARE IF:  You develop chest pain, an irregular heart beat (palpitations), or a rapid heart beat. MAKE SURE YOU:   Understand these instructions.  Will watch your condition.  Will get help right away if you are not doing well or get worse. Document Released: 12/02/2005 Document Revised: 02/24/2012 Document Reviewed: 07/22/2008 [facility] Patient Information 2015 [place], [place]. This information is not intended to replace advice given to you by your health care provider. Make sure you discuss any questions you have with your health care provider.
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
## 2014-08-31 DIAGNOSIS — E039 Hypothyroidism, unspecified: Secondary | ICD-10-CM | POA: Insufficient documentation

## 2014-10-11 ENCOUNTER — Ambulatory Visit (INDEPENDENT_AMBULATORY_CARE_PROVIDER_SITE_OTHER): Payer: Medicaid Other | Admitting: Endocrinology

## 2014-10-11 ENCOUNTER — Encounter: Payer: Self-pay | Admitting: Endocrinology

## 2014-10-11 VITALS — BP 122/70 | HR 102 | Temp 98.2°F | Ht 68.0 in | Wt 136.0 lb

## 2014-10-11 DIAGNOSIS — E039 Hypothyroidism, unspecified: Secondary | ICD-10-CM

## 2014-10-11 LAB — TSH: TSH: 3.19 u[IU]/mL (ref 0.35–4.50)

## 2014-10-11 NOTE — Patient Instructions (Addendum)
blood tests are being requested for you today.  We'll contact you with results.   Most, but not all, people continue to need this pill for the rest of their lives.   Please come back for a follow-up appointment in 3 months.
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
## 2014-10-13 ENCOUNTER — Telehealth: Payer: Self-pay

## 2014-10-13 NOTE — Telephone Encounter (Signed)
Telephone message received from [place] at [facility] care regarding not getting order to increase jevity feeding from 4 cans/day to 6 cans/day.told her I would inform Dr.Wentworth on Tuesday 10/18/14 and get back to her.Patient last seen in office in August 2015.

## 2014-10-19 NOTE — Telephone Encounter (Signed)
Ok to increase?

## 2014-11-18 ENCOUNTER — Other Ambulatory Visit (HOSPITAL_COMMUNITY): Payer: Self-pay | Admitting: Interventional Radiology

## 2014-11-18 ENCOUNTER — Ambulatory Visit (HOSPITAL_COMMUNITY)
Admission: RE | Admit: 2014-11-18 | Discharge: 2014-11-18 | Disposition: A | Discharge status: Home / Self Care | Payer: Medicaid Other | Source: Ambulatory Visit | Attending: Interventional Radiology | Admitting: Interventional Radiology

## 2014-11-18 DIAGNOSIS — Z431 Encounter for attention to gastrostomy: Secondary | ICD-10-CM | POA: Insufficient documentation

## 2014-11-18 DIAGNOSIS — R1310 Dysphagia, unspecified: Secondary | ICD-10-CM

## 2014-11-18 MED ORDER — IOHEXOL 300 MG/ML  SOLN
50.0000 mL | Freq: Once | INTRAMUSCULAR | Status: AC | PRN
  Administered 2014-11-18: 10 mL

## 2015-09-06 NOTE — H&P (Signed)
HISTORY AND PHYSICAL  Eric Dorsey is a 59 y.o. male patient  Referred by general dentist for extraction of remaining teeth.   No diagnosis found.  Past Medical History  Diagnosis Date  . Asthma     AS CHILD, NO PROBLEM SINCE  . Recurrent upper respiratory infection (URI)     COLD,  COUGH   . GERD (gastroesophageal reflux disease)   . 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 Hx of radiation therapy 11/15/10 -01/04/11    right false vocal cord    No current facility-administered medications for this encounter.   Current Outpatient Prescriptions  Medication Sig Dispense Refill  . levothyroxine (SYNTHROID, LEVOTHROID) 50 MCG tablet Take 1 tablet (50 mcg total) by mouth daily before breakfast. 30 tablet 5  . Nutritional Supplements (FEEDING SUPPLEMENT, JEVITY 1.5 CAL,) LIQD Place 237 mLs into feeding tube 4 (four) times daily. 4 cans via peg    . omeprazole (PRILOSEC) 40 MG capsule Take 40 mg by mouth daily.      . pilocarpine (SALAGEN) 5 MG tablet Take 1 tablet (5 mg total) by mouth QID. 120 tablet 3   No Known Allergies Active Problems:   * No active hospital problems. *  Vitals: There were no vitals taken for this visit. Lab results:No results found for this or any previous visit (from the past 22 hour(s)). Radiology Results: No results found. General appearance: alert, cooperative and no distress Head: Normocephalic, without obvious abnormality, atraumatic Eyes: negative Nose: Nares normal. Septum midline. Mucosa normal. No drainage or sinus tenderness. Throat: Edentulous maxilla. No trismus. Dental caries and periodontal disease teeth #'s 20, 21, 22, 23, 24, 25, 26, 27, 28, 29, 79. No purulence or exudate. Neck: no adenopathy, supple, symmetrical, trachea midline and thyroid not enlarged, symmetric, no tenderness/mass/nodules Resp: clear to auscultation bilaterally Cardio: regular rate and rhythm, S1, S2 normal, no murmur, click, rub or gallop  Assessment:Nonrestorable  teeth 20-29, 79  Plan: Dental extractions with alveoloplasty. General anesthesia. Day surgery.   [name] 09/06/2015

## 2015-09-08 ENCOUNTER — Encounter (HOSPITAL_BASED_OUTPATIENT_CLINIC_OR_DEPARTMENT_OTHER): Payer: Self-pay | Admitting: *Deleted

## 2015-09-11 ENCOUNTER — Encounter (HOSPITAL_BASED_OUTPATIENT_CLINIC_OR_DEPARTMENT_OTHER): Payer: Self-pay | Admitting: *Deleted

## 2015-09-11 ENCOUNTER — Ambulatory Visit (HOSPITAL_BASED_OUTPATIENT_CLINIC_OR_DEPARTMENT_OTHER): Payer: Medicaid Other | Admitting: Anesthesiology

## 2015-09-11 ENCOUNTER — Encounter (HOSPITAL_BASED_OUTPATIENT_CLINIC_OR_DEPARTMENT_OTHER)
Admission: RE | Disposition: A | Discharge status: Home / Self Care | Payer: Self-pay | Source: Ambulatory Visit | Attending: Oral Surgery

## 2015-09-11 ENCOUNTER — Ambulatory Visit (HOSPITAL_BASED_OUTPATIENT_CLINIC_OR_DEPARTMENT_OTHER)
Admission: RE | Admit: 2015-09-11 | Discharge: 2015-09-11 | Disposition: A | Discharge status: Home / Self Care | Payer: Medicaid Other | Source: Ambulatory Visit | Attending: Oral Surgery | Admitting: Oral Surgery

## 2015-09-11 DIAGNOSIS — E039 Hypothyroidism, unspecified: Secondary | ICD-10-CM | POA: Diagnosis not present

## 2015-09-11 DIAGNOSIS — Z8521 Personal history of malignant neoplasm of larynx: Secondary | ICD-10-CM | POA: Insufficient documentation

## 2015-09-11 DIAGNOSIS — K029 Dental caries, unspecified: Secondary | ICD-10-CM | POA: Insufficient documentation

## 2015-09-11 HISTORY — PX: MULTIPLE EXTRACTIONS WITH ALVEOLOPLASTY: SHX5342

## 2015-09-11 LAB — POCT I-STAT, CHEM 8
BUN: 9 mg/dL (ref 6–20)
CHLORIDE: 104 mmol/L (ref 101–111)
CREATININE: 1 mg/dL (ref 0.61–1.24)
Calcium, Ion: 1.24 mmol/L (ref 1.13–1.30)
GLUCOSE: 91 mg/dL (ref 65–99)
HEMATOCRIT: 25 % — AB (ref 39.0–52.0)
Hemoglobin: 8.5 g/dL — ABNORMAL LOW (ref 13.0–17.0)
POTASSIUM: 5.1 mmol/L (ref 3.5–5.1)
Sodium: 139 mmol/L (ref 135–145)
TCO2: 26 mmol/L (ref 0–100)

## 2015-09-11 SURGERY — MULTIPLE EXTRACTION WITH ALVEOLOPLASTY
Anesthesia: General | Site: Mouth

## 2015-09-11 MED ORDER — MIDAZOLAM HCL 2 MG/2ML IJ SOLN
INTRAMUSCULAR | Status: AC
  Filled 2015-09-11: qty 4

## 2015-09-11 MED ORDER — FENTANYL CITRATE (PF) 100 MCG/2ML IJ SOLN
50.0000 ug | INTRAMUSCULAR | Status: DC | PRN
  Administered 2015-09-11 (×2): 50 ug via INTRAVENOUS

## 2015-09-11 MED ORDER — AMOXICILLIN 500 MG PO CAPS: 500.0000 mg | ORAL_CAPSULE | Freq: Three times a day (TID) | ORAL | Status: DC

## 2015-09-11 MED ORDER — LACTATED RINGERS IV SOLN
INTRAVENOUS | Status: DC
  Administered 2015-09-11 (×2): via INTRAVENOUS

## 2015-09-11 MED ORDER — PROMETHAZINE HCL 25 MG/ML IJ SOLN: 6.2500 mg | INTRAMUSCULAR | Status: DC | PRN

## 2015-09-11 MED ORDER — LIDOCAINE-EPINEPHRINE 2 %-1:100000 IJ SOLN
INTRAMUSCULAR | Status: AC
  Filled 2015-09-11: qty 6.8

## 2015-09-11 MED ORDER — ONDANSETRON HCL 4 MG/2ML IJ SOLN
INTRAMUSCULAR | Status: DC | PRN
  Administered 2015-09-11: 4 mg via INTRAVENOUS

## 2015-09-11 MED ORDER — MIDAZOLAM HCL 2 MG/2ML IJ SOLN
1.0000 mg | INTRAMUSCULAR | Status: DC | PRN
  Administered 2015-09-11: 2 mg via INTRAVENOUS

## 2015-09-11 MED ORDER — PROPOFOL 10 MG/ML IV BOLUS
INTRAVENOUS | Status: DC | PRN
  Administered 2015-09-11: 50 mg via INTRAVENOUS
  Administered 2015-09-11: 30 mg via INTRAVENOUS
  Administered 2015-09-11: 150 mg via INTRAVENOUS

## 2015-09-11 MED ORDER — LIDOCAINE HCL (CARDIAC) 20 MG/ML IV SOLN
INTRAVENOUS | Status: AC
  Filled 2015-09-11: qty 5

## 2015-09-11 MED ORDER — DEXAMETHASONE SODIUM PHOSPHATE 4 MG/ML IJ SOLN
INTRAMUSCULAR | Status: DC | PRN
  Administered 2015-09-11: 10 mg via INTRAVENOUS

## 2015-09-11 MED ORDER — SCOPOLAMINE 1 MG/3DAYS TD PT72: 1.0000 | MEDICATED_PATCH | Freq: Once | TRANSDERMAL | Status: DC | PRN

## 2015-09-11 MED ORDER — PROPOFOL 500 MG/50ML IV EMUL
INTRAVENOUS | Status: AC
  Filled 2015-09-11: qty 50

## 2015-09-11 MED ORDER — FENTANYL CITRATE (PF) 100 MCG/2ML IJ SOLN
INTRAMUSCULAR | Status: AC
  Filled 2015-09-11: qty 4

## 2015-09-11 MED ORDER — HYDROMORPHONE HCL 1 MG/ML IJ SOLN
INTRAMUSCULAR | Status: AC
  Filled 2015-09-11: qty 1

## 2015-09-11 MED ORDER — BACITRACIN ZINC 500 UNIT/GM EX OINT
TOPICAL_OINTMENT | CUTANEOUS | Status: AC
  Filled 2015-09-11: qty 28.35

## 2015-09-11 MED ORDER — LIDOCAINE HCL (CARDIAC) 10 MG/ML IV SOLN
INTRAVENOUS | Status: DC | PRN
  Administered 2015-09-11: 60 mg via INTRAVENOUS

## 2015-09-11 MED ORDER — OXYCODONE-ACETAMINOPHEN 5-325 MG PO TABS
1.0000 | ORAL_TABLET | ORAL | Status: DC | PRN
Start: 2015-09-11 — End: 2017-02-10

## 2015-09-11 MED ORDER — DEXAMETHASONE SODIUM PHOSPHATE 10 MG/ML IJ SOLN
INTRAMUSCULAR | Status: AC
  Filled 2015-09-11: qty 1

## 2015-09-11 MED ORDER — ONDANSETRON HCL 4 MG/2ML IJ SOLN
INTRAMUSCULAR | Status: AC
  Filled 2015-09-11: qty 2

## 2015-09-11 MED ORDER — LIDOCAINE-EPINEPHRINE 2 %-1:100000 IJ SOLN
INTRAMUSCULAR | Status: DC | PRN
  Administered 2015-09-11: 15 mL via INTRADERMAL

## 2015-09-11 MED ORDER — SUCCINYLCHOLINE CHLORIDE 20 MG/ML IJ SOLN
INTRAMUSCULAR | Status: AC
  Filled 2015-09-11: qty 1

## 2015-09-11 MED ORDER — CEFAZOLIN SODIUM-DEXTROSE 2-3 GM-% IV SOLR
INTRAVENOUS | Status: AC
  Filled 2015-09-11: qty 50

## 2015-09-11 MED ORDER — GLYCOPYRROLATE 0.2 MG/ML IJ SOLN
INTRAMUSCULAR | Status: AC
  Filled 2015-09-11: qty 1

## 2015-09-11 MED ORDER — CEFAZOLIN SODIUM-DEXTROSE 2-3 GM-% IV SOLR
2.0000 g | INTRAVENOUS | Status: AC
  Administered 2015-09-11: 2 g via INTRAVENOUS

## 2015-09-11 MED ORDER — GLYCOPYRROLATE 0.2 MG/ML IJ SOLN
0.2000 mg | Freq: Once | INTRAMUSCULAR | Status: DC | PRN
Start: 2015-09-11 — End: 2015-09-11

## 2015-09-11 MED ORDER — HYDROMORPHONE HCL 1 MG/ML IJ SOLN
0.2500 mg | INTRAMUSCULAR | Status: DC | PRN
  Administered 2015-09-11 (×2): 0.5 mg via INTRAVENOUS

## 2015-09-11 MED ORDER — SUCCINYLCHOLINE CHLORIDE 20 MG/ML IJ SOLN
INTRAMUSCULAR | Status: DC | PRN
  Administered 2015-09-11: 100 mg via INTRAVENOUS

## 2015-09-11 SURGICAL SUPPLY — 49 items
BLADE DERMATOME SS (BLADE) IMPLANT
BLADE SURG 15 STRL LF DISP TIS (BLADE) ×1 IMPLANT
BLADE SURG 15 STRL SS (BLADE) ×3
BUR CROSS CUT FISSURE 1.6 (BURR) ×2 IMPLANT
BUR CROSS CUT FISSURE 1.6MM (BURR) ×1
BUR EGG 3PK/BX (BURR) ×2 IMPLANT
BUR EGG ELITE 4.0 (BURR) ×2 IMPLANT
BUR EGG ELITE 4.0MM (BURR) ×1
CANISTER SUCT 1200ML W/VALVE (MISCELLANEOUS) ×3 IMPLANT
CATH ROBINSON RED A/P 10FR (CATHETERS) IMPLANT
CLOSURE WOUND 1/2 X4 (GAUZE/BANDAGES/DRESSINGS)
COVER BACK TABLE 60X90IN (DRAPES) ×3 IMPLANT
COVER MAYO STAND STRL (DRAPES) ×3 IMPLANT
DECANTER SPIKE VIAL GLASS SM (MISCELLANEOUS) IMPLANT
DEPRESSOR TONGUE BLADE STERILE (MISCELLANEOUS) IMPLANT
DRAPE U-SHAPE 76X120 STRL (DRAPES) ×3 IMPLANT
DRSG TEGADERM 4X10 (GAUZE/BANDAGES/DRESSINGS) IMPLANT
GAUZE PACKING FOLDED 2  STR (GAUZE/BANDAGES/DRESSINGS) ×2
GAUZE PACKING FOLDED 2 STR (GAUZE/BANDAGES/DRESSINGS) ×1 IMPLANT
GAUZE PACKING IODOFORM 1/4X15 (GAUZE/BANDAGES/DRESSINGS) IMPLANT
GLOVE BIO SURGEON STRL SZ 6.5 (GLOVE) ×3 IMPLANT
GLOVE BIO SURGEON STRL SZ7.5 (GLOVE) ×3 IMPLANT
GLOVE BIO SURGEONS STRL SZ 6.5 (GLOVE) ×2
GLOVE BIOGEL PI IND STRL 6.5 (GLOVE) ×2 IMPLANT
GLOVE BIOGEL PI INDICATOR 6.5 (GLOVE) ×4
GOWN STRL REUS W/ TWL LRG LVL3 (GOWN DISPOSABLE) ×1 IMPLANT
GOWN STRL REUS W/ TWL XL LVL3 (GOWN DISPOSABLE) ×1 IMPLANT
GOWN STRL REUS W/TWL LRG LVL3 (GOWN DISPOSABLE) ×6
GOWN STRL REUS W/TWL XL LVL3 (GOWN DISPOSABLE) ×3
IV NS 500ML (IV SOLUTION) ×3
IV NS 500ML BAXH (IV SOLUTION) ×1 IMPLANT
NEEDLE HYPO 22GX1.5 SAFETY (NEEDLE) ×5 IMPLANT
NS IRRIG 1000ML POUR BTL (IV SOLUTION) ×3 IMPLANT
PACK BASIN DAY SURGERY FS (CUSTOM PROCEDURE TRAY) ×3 IMPLANT
SLEEVE SCD COMPRESS KNEE MED (MISCELLANEOUS) IMPLANT
SPONGE SURGIFOAM ABS GEL 12-7 (HEMOSTASIS) IMPLANT
STRIP CLOSURE SKIN 1/2X4 (GAUZE/BANDAGES/DRESSINGS) IMPLANT
SUT CHROMIC 3 0 PS 2 (SUTURE) ×4 IMPLANT
SYR 20CC LL (SYRINGE) ×2 IMPLANT
SYR BULB 3OZ (MISCELLANEOUS) ×3 IMPLANT
SYR CONTROL 10ML LL (SYRINGE) ×3 IMPLANT
TOOTHBRUSH ADULT (PERSONAL CARE ITEMS) IMPLANT
TOWEL OR 17X24 6PK STRL BLUE (TOWEL DISPOSABLE) ×3 IMPLANT
TOWEL OR NON WOVEN STRL DISP B (DISPOSABLE) ×3 IMPLANT
TRAY DSU PREP LF (CUSTOM PROCEDURE TRAY) IMPLANT
TUBE CONNECTING 20'X1/4 (TUBING) ×1
TUBE CONNECTING 20X1/4 (TUBING) ×2 IMPLANT
TUBING IRRIGATION (MISCELLANEOUS) ×3 IMPLANT
YANKAUER SUCT BULB TIP NO VENT (SUCTIONS) ×3 IMPLANT

## 2015-09-11 NOTE — H&P (Signed)
H&P documentation  -History and Physical Reviewed  -Patient has been re-examined  -No change in the plan of care  JENSEN,SCOTT M  

## 2015-09-11 NOTE — Anesthesia Preprocedure Evaluation (Signed)
Anesthesia Evaluation  Patient identified by MRN, date of birth, ID band Patient awake    Reviewed: Allergy & Precautions, NPO status   Airway Mallampati: II  TM Distance: >3 FB Neck ROM: Full    Dental   Pulmonary asthma , Recent URI , Current Smoker,    breath sounds clear to auscultation       Cardiovascular negative cardio ROS   Rhythm:Regular Rate:Normal     Neuro/Psych    GI/Hepatic Neg liver ROS, GERD  ,History noted. CE   Endo/Other  Hypothyroidism   Renal/GU negative Renal ROS     Musculoskeletal   Abdominal   Peds  Hematology   Anesthesia Other Findings   Reproductive/Obstetrics                             Anesthesia Physical Anesthesia Plan  ASA: III  Anesthesia Plan: General   Post-op Pain Management:    Induction: Intravenous  Airway Management Planned: Oral ETT  Additional Equipment:   Intra-op Plan:   Post-operative Plan:   Informed Consent: I have reviewed the patients History and Physical, chart, labs and discussed the procedure including the risks, benefits and alternatives for the proposed anesthesia with the patient or authorized representative who has indicated his/her understanding and acceptance.   Dental advisory given  Plan Discussed with:   Anesthesia Plan Comments:         Anesthesia Quick Evaluation

## 2015-09-11 NOTE — Transfer of Care (Signed)
Immediate Anesthesia Transfer of Care Note  Patient: Eric Dorsey  Procedure(s) Performed: Procedure(s): MULTIPLE TEETH EXTRACTIONS WITH ALVEOLOPLASTY (N/A)  Patient Location: PACU  Anesthesia Type:General  Level of Consciousness: awake, oriented and patient cooperative  Airway & Oxygen Therapy: Patient Spontanous Breathing and aerosol face mask  Post-op Assessment: Report given to RN and Post -op Vital signs reviewed and stable  Post vital signs: Reviewed and stable  Last Vitals:  Filed Vitals:   09/11/15 0625  BP: 166/95  Pulse: 80  Temp: 36.6 C  Resp: 20    Complications: No apparent anesthesia complications

## 2015-09-11 NOTE — Anesthesia Procedure Notes (Signed)
Procedure Name: Intubation Date/Time: 09/11/2015 8:14 AM Performed by: Lyndee Leo Pre-anesthesia Checklist: Patient identified, Emergency Drugs available, Suction available and Patient being monitored Patient Re-evaluated:Patient Re-evaluated prior to inductionOxygen Delivery Method: Circle System Utilized Preoxygenation: Pre-oxygenation with 100% oxygen Intubation Type: IV induction Ventilation: Mask ventilation without difficulty Laryngoscope Size: Miller and 3 Grade View: Grade III Tube type: Oral Number of attempts: 1 Airway Equipment and Method: Stylet and Oral airway Placement Confirmation: ETT inserted through vocal cords under direct vision,  positive ETCO2 and breath sounds checked- equal and bilateral Secured at: 22 cm Tube secured with: Tape Dental Injury: Teeth and Oropharynx as per pre-operative assessment

## 2015-09-11 NOTE — Discharge Instructions (Signed)

## 2015-09-11 NOTE — Op Note (Signed)
09/11/2015  9:00 AM  PATIENT:  Eric Dorsey  59 y.o. male  PRE-OPERATIVE DIAGNOSIS:  dental caries, unrestorable teeth# 20, 21, 22, 23, 24, 25, 26, 27, 28, 29, 79  POST-OPERATIVE DIAGNOSIS:  SAME  PROCEDURE:  Procedure(s): MULTIPLE TEETH EXTRACTIONS teeth# 20, 21, 22, 23, 24, 25, 26, 27, 28, 29, 79; WITH ALVEOLOPLASTY RIGHT AND LEFT MANDIBLE  SURGEON:  Surgeon(s): Diona Browner, DDS  ANESTHESIA:   local and general  EBL:  minimal  DRAINS: none   SPECIMEN:  No Specimen  COUNTS:  YES  PLAN OF CARE: Discharge to home after PACU  PATIENT DISPOSITION:  PACU - hemodynamically stable.   PROCEDURE DETAILS: Dictation # [id number]  [name], DMD 09/11/2015 9:00 AM

## 2015-09-11 NOTE — Anesthesia Postprocedure Evaluation (Signed)
  Anesthesia Post-op Note  Patient: Eric Dorsey  Procedure(s) Performed: Procedure(s): MULTIPLE TEETH EXTRACTIONS WITH ALVEOLOPLASTY (N/A)  Patient Location: PACU  Anesthesia Type:General  Level of Consciousness: awake and alert   Airway and Oxygen Therapy: Patient Spontanous Breathing  Post-op Pain: none  Post-op Assessment: Post-op Vital signs reviewed              Post-op Vital Signs: Reviewed  Last Vitals:  Filed Vitals:   09/11/15 1048  BP: 175/96  Pulse: 82  Temp: 36.6 C  Resp: 16    Complications: No apparent anesthesia complications

## 2015-09-12 ENCOUNTER — Encounter (HOSPITAL_BASED_OUTPATIENT_CLINIC_OR_DEPARTMENT_OTHER): Payer: Self-pay | Admitting: Oral Surgery

## 2015-09-12 NOTE — Op Note (Signed)
NAME:  Eric Dorsey, Eric Dorsey NO.:  [id number]  MEDICAL RECORD NO.:  [id number]  LOCATION:                               FACILITY:  [facility]  PHYSICIAN:  [name], M.D.  DATE OF BIRTH:  1956-06-15  DATE OF PROCEDURE:  09/11/2015 DATE OF DISCHARGE:  09/11/2015                              OPERATIVE REPORT   PREOPERATIVE DIAGNOSES:  Nonrestorable teeth secondary to dental caries and periodontal disease #20, 21, 22, 23, 24, 25, 26, 27, 28, 29, and 79.  PROCEDURE:  Extraction of teeth #20, 21, 22, 23, 24, 25, 26, 27, 28, 29 and 79.  Alveoplasty of right and left mandible.  SURGEON:  [name], M.D.  ANESTHESIA:  Finis Bud, M.D., general oral intubation.  DESCRIPTION OF PROCEDURE:  The patient was taken to the operating room and placed on the table in supine position.  General anesthesia was administered intravenously and an oral endotracheal tube was placed and marked.  The eyes were protected and the patient was draped for the procedure.  Time-out was performed.  The posterior pharynx was suctioned and a throat pack was placed.  A 2% lidocaine with 1:100,000 epinephrine was infiltrated in an inferior alveolar block on the right and left side and then buccal and lingual infiltration of the anterior mandible, total of 15 mL was utilized.  A bite block was placed on the right side of the mouth and a Sweetheart retractor was used to retract the tongue.  A #15 blade was used to make an incision both buccally and lingually around teeth #20, 21, 22, 23, 24, 25, and 26.  The periosteum was reflected to expose the teeth.  The teeth were elevated with a 301 elevator and removed from the mouth with the Asch forceps.  Then, the sockets were curetted.  The periosteum was reflected further to expose the entire alveolar crest and then an egg-shaped bur and bone file were used to perform an alveoplasty on this side.  Then, the area was irrigated and closed with 3-0  chromic.  The endotracheal tube was repositioned to the alternate side of the mouth and the bite block and Sweetheart retractor were repositioned as well.  Then, a 15-blade was used to make an incision around teeth #27, 28, 29 and 79 on the buccal and lingual aspects.  The periosteum was reflected with a periosteal elevator.  The teeth were elevated with a 301 elevator and removed from the mouth with Asch forceps.  The sockets were curetted and then the periosteum was further reflected to expose the alveolar crest on the buccal and lingual aspect, and then the egg-shaped bur and bone file were used to perform the alveoplasty.  Then, the area was irrigated and closed with 3-0 chromic.  Then, the oral cavity was irrigated and suctioned, and inspected and found to have good contour, hemostasis and closure.  Then, the throat pack was removed.  The patient was awakened, taken to the recovery room, breathing spontaneously in good condition.  ESTIMATED BLOOD LOSS:  Minimum.  COMPLICATIONS:  None.  SPECIMENS:  None.     [name], M.D.     SMJ/MEDQ  D:  09/11/2015  T:  09/12/2015  Job:  [id number]

## 2015-10-26 ENCOUNTER — Other Ambulatory Visit: Payer: Self-pay | Admitting: Adult Health

## 2015-10-26 ENCOUNTER — Ambulatory Visit (HOSPITAL_COMMUNITY)
Admission: RE | Admit: 2015-10-26 | Discharge: 2015-10-26 | Disposition: A | Discharge status: Home / Self Care | Payer: Medicaid Other | Source: Ambulatory Visit | Attending: Radiation Oncology | Admitting: Radiation Oncology

## 2015-10-26 ENCOUNTER — Ambulatory Visit
Admission: RE | Admit: 2015-10-26 | Discharge: 2015-10-26 | Disposition: A | Discharge status: Home / Self Care | Payer: Medicaid Other | Source: Ambulatory Visit | Attending: Radiation Oncology | Admitting: Radiation Oncology

## 2015-10-26 VITALS — BP 141/77 | HR 106 | Temp 98.0°F | Resp 16 | Wt 137.5 lb

## 2015-10-26 DIAGNOSIS — C321 Malignant neoplasm of supraglottis: Secondary | ICD-10-CM

## 2015-10-26 DIAGNOSIS — Z923 Personal history of irradiation: Secondary | ICD-10-CM

## 2015-10-26 DIAGNOSIS — R5382 Chronic fatigue, unspecified: Secondary | ICD-10-CM

## 2015-10-26 DIAGNOSIS — E039 Hypothyroidism, unspecified: Secondary | ICD-10-CM | POA: Insufficient documentation

## 2015-10-26 LAB — TSH CHCC: TSH: 5.164 m[IU]/L — AB (ref 0.320–4.118)

## 2015-10-26 MED ORDER — LARYNGOSCOPY SOLUTION RAD-ONC
15.0000 mL | Freq: Once | TOPICAL | Status: AC
  Administered 2015-10-26: 15 mL via TOPICAL
  Filled 2015-10-26: qty 15

## 2015-10-26 MED ORDER — PHENYLEPHRINE HCL 1 % NA SOLN: 1.0000 [drp] | Freq: Once | NASAL | Status: DC

## 2015-10-26 MED ORDER — LEVOTHYROXINE SODIUM 50 MCG PO TABS: 50.0000 ug | ORAL_TABLET | Freq: Every day | ORAL | Status: DC

## 2015-10-26 NOTE — Progress Notes (Signed)
   Department of Radiation Oncology  Phone:  [phone] Fax:        [phone]   Name: Eric Dorsey MRN: [id number]  DOB: [birth year]  Date: 10/26/2015  Follow Up Visit Note  Diagnosis: Locally advanced laryngeal cancer  Summary and Interval since last radiation: Chemoradiation completed 01/04/2011.  Interval History: Kasten presents today for routine followup. He presents for fu today in request to remove feeding tube. He has not been using feeding tube regularly but has been maintaining his weight. His weight last year was 129 and today it is 137. He had a negative chest x-ray in August last year. His TSH was elevated and was referred to endocrinology. He was placed on synthroid. TSH in October of last year was normal. He has had several teeth removed a couple months ago. Not taking Synthroid because he ran out of refills.  Allergies: No Known Allergies  Medications:  Current Outpatient Prescriptions  Medication Sig Dispense Refill  . amoxicillin (AMOXIL) 500 MG capsule Take 1 capsule (500 mg total) by mouth 3 (three) times daily. 21 capsule 0  . levothyroxine (SYNTHROID, LEVOTHROID) 50 MCG tablet Take 1 tablet (50 mcg total) by mouth daily before breakfast. 30 tablet 5  . omeprazole (PRILOSEC) 40 MG capsule Take 40 mg by mouth daily.      Marland Kitchen oxyCODONE-acetaminophen (PERCOCET) 5-325 MG per tablet Take 1-2 tablets by mouth every 4 (four) hours as needed for severe pain. 40 tablet 0   No current facility-administered medications for this encounter.    Physical Exam:  There were no vitals filed for this visit. Wt Readings from Last 3 Encounters:  09/11/15 136 lb (61.689 kg)  10/11/14 136 lb (61.689 kg)  08/30/14 134 lb (60.782 kg)    IMPRESSION: Watkins is a 59 y.o. male status post concurrent chemoradiation with no evidence of disease.  PLAN: Patient will get feeding tube removed this afternoon and will follow up in 1 year with Mike Craze, NP. Patient will follow up  with Endocrinologist to get more Synthroid. We will check a TSH and CXR today.   Thea Silversmith, MD  This document serves as a record of services personally performed by Thea Silversmith, MD. It was created on her behalf by  Lendon Collar, a trained medical scribe. The creation of this record is based on the scribe's personal observations and the provider's statements to them. This document has been checked and approved by the attending provider.

## 2015-10-26 NOTE — Progress Notes (Signed)
NURSING EVALUATION  Radiation Oncology: Dr. 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 I don't remember."   Most recent TSH: 3.19 (10/11/14)  Most recent Dental visit: s/p multiple teeth extractions with alveoloplasty (Dr. Diona Browner, DDS) on 09/11/15.  Signs/Symptoms:   Dry mouth: Yes  Weight changes, if any: No  Dysphagia, if any: No  Neck lymphedema, if any: No  Pain issues, if any: No  G-tube: Still in place. Only using for flushes. Able to tolerate soft foods only, since recent teeth extraction.  No longer getting Jevity tube feedings delivered.   Tobacco/ETOH use: Yes. Smoking about 2 cigarettes/day. Advised patient about smoking cessation. He is interested in nicotine patch.   Vitals:  Filed Vitals:   10/26/15 0900  BP: 141/77  Pulse: 106  Temp: 98 F (36.7 C)  Resp: 16    Weight:  Filed Weights   10/26/15 0900  Weight: 137 lb 8 oz (62.37 kg)   Current complaints/concerns: Eric Dorsey is here for follow-up for cancer of the supraglottis.  He is concerned that he may not be able to keep his weight up with Boost and soft foods alone.  He's unsure if he needs to resume Jevity tube feedings; if so, then he will need new prescription.  He has decreased stamina and strength. Has never had PT that he can recall.      Eric Craze, NP 10/26/15

## 2015-10-30 ENCOUNTER — Encounter: Payer: Self-pay | Admitting: Adult Health

## 2015-10-30 ENCOUNTER — Other Ambulatory Visit: Payer: Self-pay | Admitting: Adult Health

## 2015-10-30 DIAGNOSIS — Z923 Personal history of irradiation: Secondary | ICD-10-CM

## 2015-10-30 DIAGNOSIS — C321 Malignant neoplasm of supraglottis: Secondary | ICD-10-CM

## 2015-10-30 DIAGNOSIS — E039 Hypothyroidism, unspecified: Secondary | ICD-10-CM

## 2015-10-30 NOTE — Progress Notes (Signed)
Eric Dorsey will see me in survivorship in 10/2016 with laryngoscopy and TSH check.  I have made these appointments and mailed them to his home, as he requested.  I encouraged him to call me with any questions for concerns before his next visit here. I look forward to participating in his care.   Eric Craze, NP [place] [phone]

## 2016-10-24 NOTE — Progress Notes (Deleted)
CLINIC:  Survivorship  REASON FOR VISIT:  Routine follow-up for history of head & neck cancer.  BRIEF ONCOLOGIC HISTORY:     INTERVAL HISTORY:  ***  -Pain:  -Nutrition/Diet:  -Dysphagia?:  -Dental issues?: using fluoride trays?  -Last TSH:  -Weight: (LOSS/GAIN) since (last visit)   -Last ENT visit:   -Last Rad Onc visit:  -Last Dentist visit:     ADDITIONAL REVIEW OF SYSTEMS:  ROS    CURRENT MEDICATIONS:  Current Outpatient Prescriptions on File Prior to Visit  Medication Sig Dispense Refill  . amoxicillin (AMOXIL) 500 MG capsule Take 1 capsule (500 mg total) by mouth 3 (three) times daily. 21 capsule 0  . levothyroxine (SYNTHROID, LEVOTHROID) 50 MCG tablet Take 1 tablet (50 mcg total) by mouth daily before breakfast. 30 tablet 5  . omeprazole (PRILOSEC) 40 MG capsule Take 40 mg by mouth daily.      Marland Kitchen oxyCODONE-acetaminophen (PERCOCET) 5-325 MG per tablet Take 1-2 tablets by mouth every 4 (four) hours as needed for severe pain. 40 tablet 0   No current facility-administered medications on file prior to visit.     ALLERGIES:  No Known Allergies   PHYSICAL EXAM:  There were no vitals filed for this visit. There were no vitals filed for this visit.  Weight Date                     Pre-treatment (RT consult date):    General: Well-nourished, well-appearing male/male*** in no acute distress.  Accompanied/Unaccompanied today.  HEENT: Head is atraumatic and normocephalic.  Pupils equal and reactive to light. Conjunctivae clear without exudate.  Sclerae anicteric. Oral mucosa is pink and moist without lesions.  Tongue pink, moist, and midline. Oropharynx is pink and moist, without lesions. Lymph: No preauricular, postauricular, cervical, supraclavicular, or infraclavicular lymphadenopathy noted on palpation.   Neck: No palpable masses. Skin on neck is ***.  Cardiovascular: Normal rate and rhythm. Respiratory: Clear to auscultation bilaterally. Chest  expansion symmetric without accessory muscle use; breathing non-labored.  GI: Abdomen soft and round. Non-tender, non-distended. Bowel sounds normoactive.  GU: Deferred.   Neuro: No focal deficits. Steady gait.   Psych: Normal mood and affect for situation. Extremities: No edema.  Skin: Warm and dry.    LABORATORY DATA:  None at this visit.***  DIAGNOSTIC IMAGING:  None at this visit.    ASSESSMENT & PLAN:  Mr. Bonifield is a pleasant 60 y.o. male/male*** with history of ***, diagnosed in ***(date);  treated with ***; completed treatment on (date).  Patient presents to survivorship clinic today for routine follow-up after finishing treatment.   1. Cancer***:  Mr. Benion is clinically without evidence of disease or recurrence on physical exam today.     #. Nutritional status: Mr. Armand reports that he is currently able to consume adequate nutrition by mouth/via tube***.  Weight is stable at *** lbs today.  Encouraged to continue to consume adequate hydration and nutrition, as tolerated.    #. At risk for dysphagia: Given Mr. Altice's treatment for *** cancer, which included surgery and radiation therapy***, he is at risk for chronic dysphagia.  he reports having difficulty with breads and meats, but is able to consume soft foods and liquids without difficulty.  I encouraged him to continue to perform the swallowing exercises, as directed by Garald Balding, SLP.  If Mr. Maga requires further swallowing or speech therapy evaluation, I will be happy to place that referral, if needed.  Currently, the patient's  reported swallowing concerns are to be expected and stable.    #.  At risk for neck lymphedema:  When patients with head & neck cancers are treated with surgery and/or radiation therapy, there is an associated increased risk of neck lymphedema.  Mr. Scala reports that currently he is experiencing what would be considered mild symptoms. he does/does not*** have a neck compression garment  and reports wearing/not wearing*** it, as directed.  I encouraged Mr. Gaines to continue to wear the compression garment and practice the massage techniques to reduce the presence of lymphedema.  If his symptoms worsen, I would happy to place a formal referral to physical therapy for further evaluation and treatment.     #.  At risk for hypothyroidism: The thyroid gland is often affected after treatment for head & neck cancer.  Mr. Chand most recent TSH was normal at *** on (date).  If appropriate, he will be prescribed thyroid supplement with Levothyroxine. We discussed that he will continue to have serial TSH monitoring for at least the next 5 years as part of his routine follow-up and post-cancer treatment care.   #. At risk for tooth decay/dental concerns: After treatment with radiation for head & neck cancers, patients often experience xerostomia which increases their risk of dental caries. Mr. Comins was encouraged to see his dentist 3-4 times per year. The patient should also continue to use his fluoride trays daily, as directed by dentistry.  I encouraged him to reach out to either Dr. Enrique Sack (oncology dentist) or his primary care dentist with additional questions or concerns.   #.  Lung cancer screening:  [facility] now offers eligible patients lung cancer screening with a low-dose chest CT to aid in early detection, provide more effective treatment options, and ultimately improve survival benefits for patients diagnosed early.  Below is the selection criteria for screening:  . Medicare patients: 55-77 years; privately insured patients 55-80 years. . Active or former smokers who have quit within the last 15 years. . 30+ pack-year history of smoking  . Exclusion criteria - No signs/symptoms of lung cancer (i.e., no recent history of hemoptysis and no unexplained weight loss >15 pounds in the last 6 months). . Willing and healthy enough to undergo biopsies/surgery if needed.  Mr. Stallman  currently does/does not*** meet criteria for lung cancer screening.  Therefore, I have/have not*** placed a referral for screening today.    #. Tobacco & alcohol use: Mr. Kindschi reports that he quit smoking in *** and continues to abstain from all tobacco products.  I congratulated his continued efforts to remain tobacco free.  I also reinforced the importance of avoiding alcohol consumption as well.  Both tobacco and alcohol use in patients with head & neck cancer increases the risk of recurrence.  They also increase the risk of other cancers, as well.  Mr. Kershner states that he voiced understanding of the importance of continuing to remain both tobacco and alcohol-free.    #. Health maintenance and wellness promotion: Cancer patients who consume a diet rich in fruits and vegetables have better overall health and decreased risk of cancer recurrence. Mr. Monteiro was encouraged to consume 5-7 servings of fruits and vegetables per day, as tolerated. Mr. Gato was also encouraged to engage in moderate to vigorous exercise for 30 minutes per day most days of the week.   #. Support services/counseling: It is not uncommon for this period of the patient's cancer care trajectory to be one of many emotions and  stressors.  We discussed an opportunity for him to participate in the next session of Head & Neck FYNN ("Finding Your New Normal") support group series, designed for patients after they have completed treatment.   Mr. Gabourel was encouraged to take advantage of our many other support services programs, support groups, and/or counseling in coping with his new life as a cancer survivor after completing anti-cancer treatment. The patient was offered support today through active listening and expressive supportive counseling.     Dispo:  -See Dr. Marland Kitchen (ENT) in ***/2017 -Return to cancer center to see Dr. Isidore Moos in ***/2017 -Return to cancer center to see Survivorship NP in ***.      A total of *** minutes  was spent in the face-to-face care of this patient, with greater than 50% of that time spent in counseling and care-coordination.    Mike Craze, NP [place] [phone]

## 2016-10-25 ENCOUNTER — Ambulatory Visit: Payer: Medicaid Other | Admitting: Adult Health

## 2016-10-25 ENCOUNTER — Other Ambulatory Visit: Payer: Medicaid Other

## 2017-01-02 ENCOUNTER — Telehealth: Payer: Self-pay | Admitting: Oncology

## 2017-01-02 NOTE — Telephone Encounter (Signed)
Called [facility] regarding refill request for levothyroxine.  Advised them per Dr. Unknown Jim last note, that request should be sent to Dr. Loanne Drilling.

## 2017-02-10 ENCOUNTER — Emergency Department (HOSPITAL_COMMUNITY): Payer: Medicaid Other

## 2017-02-10 ENCOUNTER — Emergency Department (HOSPITAL_COMMUNITY)
Admission: EM | Admit: 2017-02-10 | Discharge: 2017-02-10 | Disposition: A | Discharge status: Home / Self Care | Payer: Medicaid Other | Attending: Emergency Medicine | Admitting: Emergency Medicine

## 2017-02-10 ENCOUNTER — Encounter (HOSPITAL_COMMUNITY): Payer: Self-pay | Admitting: Emergency Medicine

## 2017-02-10 DIAGNOSIS — J45909 Unspecified asthma, uncomplicated: Secondary | ICD-10-CM | POA: Insufficient documentation

## 2017-02-10 DIAGNOSIS — Z8521 Personal history of malignant neoplasm of larynx: Secondary | ICD-10-CM | POA: Insufficient documentation

## 2017-02-10 DIAGNOSIS — R079 Chest pain, unspecified: Secondary | ICD-10-CM | POA: Diagnosis present

## 2017-02-10 DIAGNOSIS — E039 Hypothyroidism, unspecified: Secondary | ICD-10-CM | POA: Diagnosis not present

## 2017-02-10 DIAGNOSIS — R1013 Epigastric pain: Secondary | ICD-10-CM | POA: Diagnosis not present

## 2017-02-10 DIAGNOSIS — F17200 Nicotine dependence, unspecified, uncomplicated: Secondary | ICD-10-CM | POA: Insufficient documentation

## 2017-02-10 LAB — BASIC METABOLIC PANEL
ANION GAP: 8 (ref 5–15)
BUN: 9 mg/dL (ref 6–20)
CALCIUM: 9.3 mg/dL (ref 8.9–10.3)
CHLORIDE: 104 mmol/L (ref 101–111)
CO2: 25 mmol/L (ref 22–32)
Creatinine, Ser: 0.95 mg/dL (ref 0.61–1.24)
GFR calc Af Amer: >60 mL/min (ref >60)
GFR calc non Af Amer: >60 mL/min (ref >60)
GLUCOSE: 96 mg/dL (ref 65–99)
Potassium: 3.6 mmol/L (ref 3.5–5.1)
Sodium: 137 mmol/L (ref 135–145)

## 2017-02-10 LAB — CBC
HCT: 25.1 % — ABNORMAL LOW (ref 39.0–52.0)
HEMOGLOBIN: 7.7 g/dL — AB (ref 13.0–17.0)
MCH: 22.8 pg — AB (ref 26.0–34.0)
MCHC: 30.7 g/dL (ref 30.0–36.0)
MCV: 74.5 fL — AB (ref 78.0–100.0)
Platelets: 583 10*3/uL — ABNORMAL HIGH (ref 150–400)
RBC: 3.37 MIL/uL — ABNORMAL LOW (ref 4.22–5.81)
RDW: 28.3 % — ABNORMAL HIGH (ref 11.5–15.5)
WBC: 7 10*3/uL (ref 4.0–10.5)

## 2017-02-10 LAB — I-STAT TROPONIN, ED: TROPONIN I, POC: 0 ng/mL (ref 0.00–0.08)

## 2017-02-10 LAB — POC OCCULT BLOOD, ED: FECAL OCCULT BLD: NEGATIVE

## 2017-02-10 MED ORDER — IOPAMIDOL (ISOVUE-300) INJECTION 61%
75.0000 mL | Freq: Once | INTRAVENOUS | Status: AC | PRN
  Administered 2017-02-10: 75 mL via INTRAVENOUS

## 2017-02-10 MED ORDER — PANTOPRAZOLE SODIUM 20 MG PO TBEC: 20.0000 mg | DELAYED_RELEASE_TABLET | Freq: Every day | ORAL | 0 refills | Status: DC

## 2017-02-10 MED ORDER — IOPAMIDOL (ISOVUE-300) INJECTION 61%
INTRAVENOUS | Status: AC
  Filled 2017-02-10: qty 75

## 2017-02-10 MED ORDER — FERROUS SULFATE 325 (65 FE) MG PO TABS
325.0000 mg | ORAL_TABLET | Freq: Every day | ORAL | 0 refills | Status: DC
Start: 2017-02-10 — End: 2018-03-07

## 2017-02-10 NOTE — Discharge Instructions (Signed)
Please take Protonix once a day. Please take as soon as you wake up and 30 minutes before meal to help line stomach. Please also take iron pill once a day on empty stomach with orange juice. Do not drink milk when taking iron pill, wait at least 2 hours. Drink plenty of fluids.  Eat solids as tolerated. Please follow-up with   Contact a health care provider if: You have new symptoms. You have unexplained weight loss. You have difficulty swallowing, or it hurts to swallow. You have wheezing or a persistent cough. Your symptoms do not improve with treatment. You have frequent heartburn for more than two weeks. Get help right away if: You have severe pain in your arms, neck, jaw, teeth, or back. You feel sweaty, dizzy, or light-headed. You have chest pain or shortness of breath. You vomit and your vomit looks like blood or coffee grounds. Your stool is bloody or black. You have a fever. You cannot swallow, drink, or eat.

## 2017-02-10 NOTE — ED Notes (Signed)
Patient was alert, oriented and stable upon discharge. RN went over AVS and patient had no further questions.  

## 2017-02-10 NOTE — ED Triage Notes (Signed)
Pt reports R side CP since last night.  Intermittent SOB, dizziness, and nausea.  Pt also reports he is unable to swallow. Food/po fluids gets to esophagus, but will not pass to stomach. Has had progressive difficulty in swallowing food. Pt speaking in full sentences with no apparent difficulty.
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
## 2017-02-11 LAB — TYPE AND SCREEN
ABO/RH(D): O POS
ANTIBODY SCREEN: POSITIVE

## 2017-07-28 ENCOUNTER — Emergency Department (HOSPITAL_COMMUNITY): Payer: Medicaid Other

## 2017-07-28 ENCOUNTER — Encounter (HOSPITAL_COMMUNITY): Payer: Self-pay | Admitting: Emergency Medicine

## 2017-07-28 ENCOUNTER — Emergency Department (HOSPITAL_COMMUNITY)
Admission: EM | Admit: 2017-07-28 | Discharge: 2017-07-28 | Disposition: A | Discharge status: Home / Self Care | Payer: Medicaid Other | Attending: Emergency Medicine | Admitting: Emergency Medicine

## 2017-07-28 DIAGNOSIS — S90861A Insect bite (nonvenomous), right foot, initial encounter: Secondary | ICD-10-CM | POA: Diagnosis not present

## 2017-07-28 DIAGNOSIS — Z79899 Other long term (current) drug therapy: Secondary | ICD-10-CM | POA: Diagnosis not present

## 2017-07-28 DIAGNOSIS — F17210 Nicotine dependence, cigarettes, uncomplicated: Secondary | ICD-10-CM | POA: Insufficient documentation

## 2017-07-28 DIAGNOSIS — Y939 Activity, unspecified: Secondary | ICD-10-CM | POA: Diagnosis not present

## 2017-07-28 DIAGNOSIS — M79674 Pain in right toe(s): Secondary | ICD-10-CM | POA: Diagnosis present

## 2017-07-28 DIAGNOSIS — W57XXXA Bitten or stung by nonvenomous insect and other nonvenomous arthropods, initial encounter: Secondary | ICD-10-CM | POA: Diagnosis not present

## 2017-07-28 DIAGNOSIS — Y999 Unspecified external cause status: Secondary | ICD-10-CM | POA: Diagnosis not present

## 2017-07-28 DIAGNOSIS — Y929 Unspecified place or not applicable: Secondary | ICD-10-CM | POA: Diagnosis not present

## 2017-07-28 DIAGNOSIS — L089 Local infection of the skin and subcutaneous tissue, unspecified: Secondary | ICD-10-CM | POA: Diagnosis not present

## 2017-07-28 MED ORDER — INDOMETHACIN 25 MG PO CAPS: 25.0000 mg | ORAL_CAPSULE | Freq: Three times a day (TID) | ORAL | 0 refills | Status: DC | PRN

## 2017-07-28 MED ORDER — DOXYCYCLINE HYCLATE 100 MG PO CAPS: 100.0000 mg | ORAL_CAPSULE | Freq: Two times a day (BID) | ORAL | 0 refills | Status: DC

## 2017-07-28 NOTE — ED Triage Notes (Signed)
Pt states that he has had R great toe pain since Friday. States he may have stepped down off a step wrong. Alert and oriented.
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
## 2017-07-28 NOTE — ED Notes (Signed)
Patient transported to X-ray 

## 2017-07-28 NOTE — Discharge Instructions (Signed)
Your big toe is swollen and has bruising. The x-ray shows no fracture or dislocation or infection in the bone. There appears to be an insect bite that may have some surrounding infection so I am starting you on antibiotics. This could also be gout so I am giving you medication for pain and inflammation. Elevate the foot as often as possible and follow up with your doctor or return here for worsening symptoms.

## 2018-03-07 ENCOUNTER — Encounter (HOSPITAL_COMMUNITY): Payer: Self-pay

## 2018-03-07 ENCOUNTER — Observation Stay (HOSPITAL_COMMUNITY)
Admission: EM | Admit: 2018-03-07 | Discharge: 2018-03-09 | Disposition: A | Discharge status: Home / Self Care | Payer: Medicaid Other | Attending: Internal Medicine | Admitting: Internal Medicine

## 2018-03-07 ENCOUNTER — Emergency Department (HOSPITAL_COMMUNITY): Payer: Medicaid Other

## 2018-03-07 ENCOUNTER — Other Ambulatory Visit: Payer: Self-pay

## 2018-03-07 DIAGNOSIS — N400 Enlarged prostate without lower urinary tract symptoms: Secondary | ICD-10-CM | POA: Insufficient documentation

## 2018-03-07 DIAGNOSIS — D649 Anemia, unspecified: Secondary | ICD-10-CM | POA: Insufficient documentation

## 2018-03-07 DIAGNOSIS — Z923 Personal history of irradiation: Secondary | ICD-10-CM | POA: Diagnosis not present

## 2018-03-07 DIAGNOSIS — Z8521 Personal history of malignant neoplasm of larynx: Secondary | ICD-10-CM | POA: Diagnosis not present

## 2018-03-07 DIAGNOSIS — K219 Gastro-esophageal reflux disease without esophagitis: Secondary | ICD-10-CM | POA: Diagnosis not present

## 2018-03-07 DIAGNOSIS — D696 Thrombocytopenia, unspecified: Secondary | ICD-10-CM | POA: Diagnosis not present

## 2018-03-07 DIAGNOSIS — J449 Chronic obstructive pulmonary disease, unspecified: Secondary | ICD-10-CM | POA: Diagnosis not present

## 2018-03-07 DIAGNOSIS — Z79899 Other long term (current) drug therapy: Secondary | ICD-10-CM | POA: Insufficient documentation

## 2018-03-07 DIAGNOSIS — R627 Adult failure to thrive: Secondary | ICD-10-CM | POA: Diagnosis not present

## 2018-03-07 DIAGNOSIS — I700 Atherosclerosis of aorta: Secondary | ICD-10-CM | POA: Diagnosis not present

## 2018-03-07 DIAGNOSIS — M47812 Spondylosis without myelopathy or radiculopathy, cervical region: Secondary | ICD-10-CM | POA: Insufficient documentation

## 2018-03-07 DIAGNOSIS — I712 Thoracic aortic aneurysm, without rupture: Secondary | ICD-10-CM | POA: Diagnosis not present

## 2018-03-07 DIAGNOSIS — R0602 Shortness of breath: Secondary | ICD-10-CM

## 2018-03-07 DIAGNOSIS — R4702 Dysphasia: Secondary | ICD-10-CM | POA: Diagnosis not present

## 2018-03-07 LAB — CBC WITH DIFFERENTIAL/PLATELET
BASOS ABS: 0.1 10*3/uL (ref 0.0–0.1)
Basophils Relative: 1 %
Eosinophils Absolute: 0.1 10*3/uL (ref 0.0–0.7)
Eosinophils Relative: 1 %
HCT: 20 % — ABNORMAL LOW (ref 39.0–52.0)
HEMOGLOBIN: 5.6 g/dL — AB (ref 13.0–17.0)
LYMPHS PCT: 14 %
Lymphs Abs: 0.9 10*3/uL (ref 0.7–4.0)
MCH: 19.8 pg — ABNORMAL LOW (ref 26.0–34.0)
MCHC: 28 g/dL — AB (ref 30.0–36.0)
MCV: 70.7 fL — ABNORMAL LOW (ref 78.0–100.0)
MONO ABS: 0.4 10*3/uL (ref 0.1–1.0)
Monocytes Relative: 6 %
NEUTROS PCT: 78 %
Neutro Abs: 4.8 10*3/uL (ref 1.7–7.7)
PLATELETS: 125 10*3/uL — AB (ref 150–400)
RBC: 2.83 MIL/uL — ABNORMAL LOW (ref 4.22–5.81)
RDW: 23.9 % — ABNORMAL HIGH (ref 11.5–15.5)
WBC: 6.3 10*3/uL (ref 4.0–10.5)

## 2018-03-07 LAB — COMPREHENSIVE METABOLIC PANEL
ALK PHOS: 71 U/L (ref 38–126)
ALT: 11 U/L — ABNORMAL LOW (ref 17–63)
ANION GAP: 12 (ref 5–15)
AST: 21 U/L (ref 15–41)
Albumin: 3.9 g/dL (ref 3.5–5.0)
BILIRUBIN TOTAL: 0.3 mg/dL (ref 0.3–1.2)
BUN: 5 mg/dL — ABNORMAL LOW (ref 6–20)
CALCIUM: 8.7 mg/dL — AB (ref 8.9–10.3)
CO2: 24 mmol/L (ref 22–32)
Chloride: 103 mmol/L (ref 101–111)
Creatinine, Ser: 1.1 mg/dL (ref 0.61–1.24)
Glucose, Bld: 104 mg/dL — ABNORMAL HIGH (ref 65–99)
Potassium: 3.2 mmol/L — ABNORMAL LOW (ref 3.5–5.1)
Sodium: 139 mmol/L (ref 135–145)
TOTAL PROTEIN: 7.1 g/dL (ref 6.5–8.1)

## 2018-03-07 LAB — RETICULOCYTES
RBC.: 2.72 MIL/uL — ABNORMAL LOW (ref 4.22–5.81)
RETIC COUNT ABSOLUTE: 40.8 10*3/uL (ref 19.0–186.0)
Retic Ct Pct: 1.5 % (ref 0.4–3.1)

## 2018-03-07 LAB — URINALYSIS, ROUTINE W REFLEX MICROSCOPIC
Bilirubin Urine: NEGATIVE
Glucose, UA: NEGATIVE mg/dL
HGB URINE DIPSTICK: NEGATIVE
Ketones, ur: NEGATIVE mg/dL
LEUKOCYTES UA: NEGATIVE
Nitrite: NEGATIVE
Protein, ur: NEGATIVE mg/dL
SPECIFIC GRAVITY, URINE: 1.018 (ref 1.005–1.030)
pH: 6 (ref 5.0–8.0)

## 2018-03-07 LAB — I-STAT CG4 LACTIC ACID, ED
LACTIC ACID, VENOUS: 0.91 mmol/L (ref 0.5–1.9)
Lactic Acid, Venous: 1.02 mmol/L (ref 0.5–1.9)

## 2018-03-07 LAB — POC OCCULT BLOOD, ED: Fecal Occult Bld: NEGATIVE

## 2018-03-07 MED ORDER — IOPAMIDOL (ISOVUE-300) INJECTION 61%
INTRAVENOUS | Status: AC
  Administered 2018-03-07: 80 mL via INTRAVENOUS
  Filled 2018-03-07: qty 100

## 2018-03-07 MED ORDER — ONDANSETRON HCL 4 MG/2ML IJ SOLN: 4.0000 mg | Freq: Four times a day (QID) | INTRAMUSCULAR | Status: DC | PRN

## 2018-03-07 MED ORDER — PANTOPRAZOLE SODIUM 40 MG PO TBEC
40.0000 mg | DELAYED_RELEASE_TABLET | Freq: Every day | ORAL | Status: DC
  Administered 2018-03-08 – 2018-03-09 (×2): 40 mg via ORAL
  Filled 2018-03-07 (×2): qty 1

## 2018-03-07 MED ORDER — ADULT MULTIVITAMIN W/MINERALS CH
1.0000 | ORAL_TABLET | Freq: Every day | ORAL | Status: DC
  Administered 2018-03-08 – 2018-03-09 (×2): 1 via ORAL
  Filled 2018-03-07 (×2): qty 1

## 2018-03-07 MED ORDER — POTASSIUM CHLORIDE 20 MEQ/15ML (10%) PO SOLN
40.0000 meq | Freq: Once | ORAL | Status: AC
  Administered 2018-03-07: 40 meq via ORAL
  Filled 2018-03-07: qty 30

## 2018-03-07 MED ORDER — ONDANSETRON HCL 4 MG PO TABS: 4.0000 mg | ORAL_TABLET | Freq: Four times a day (QID) | ORAL | Status: DC | PRN

## 2018-03-07 MED ORDER — SODIUM CHLORIDE 0.9 % IV SOLN
10.0000 mL/h | Freq: Once | INTRAVENOUS | Status: AC
  Administered 2018-03-07: 10 mL/h via INTRAVENOUS

## 2018-03-07 MED ORDER — BOOST / RESOURCE BREEZE PO LIQD CUSTOM
1.0000 | Freq: Three times a day (TID) | ORAL | Status: DC
  Administered 2018-03-08 – 2018-03-09 (×2): 1 via ORAL

## 2018-03-07 MED ORDER — ACETAMINOPHEN 650 MG RE SUPP: 650.0000 mg | Freq: Four times a day (QID) | RECTAL | Status: DC | PRN

## 2018-03-07 MED ORDER — ACETAMINOPHEN 325 MG PO TABS: 650.0000 mg | ORAL_TABLET | Freq: Four times a day (QID) | ORAL | Status: DC | PRN

## 2018-03-07 MED ORDER — POTASSIUM CHLORIDE CRYS ER 20 MEQ PO TBCR: 40.0000 meq | EXTENDED_RELEASE_TABLET | Freq: Once | ORAL | Status: DC

## 2018-03-07 NOTE — ED Notes (Signed)
ED TO INPATIENT HANDOFF REPORT  Name/Age/Gender Eric Dorsey 62 y.o. male  Code Status    Code Status Orders  (From admission, onward)        Start     Ordered   03/07/18 2000  Full code  Continuous     03/07/18 2000    Code Status History    This patient has a current code status but no historical code status.      Home/SNF/Other Home  Chief Complaint Shortness of breath  Level of Care/Admitting Diagnosis ED Disposition    ED Disposition Condition Comment   Admit  Hospital Area: [facility] [[id number]]  Level of Care: Telemetry [5]  Admit to tele based on following criteria: Other see comments  Comments: anemia  Diagnosis: Symptomatic anemia [[id number]]  Admitting Physician: Doreatha Massed  Attending Physician: Etta Quill [[id number]]  PT Class (Do Not Modify): Observation [104]  PT Acc Code (Do Not Modify): Observation [10022]       Medical History Past Medical History:  Diagnosis Date  . Asthma    AS CHILD, NO PROBLEM SINCE  . 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 GERD (gastroesophageal reflux disease)   . Hx of radiation therapy 11/15/10 -01/04/11   right false vocal cord  . Recurrent upper respiratory infection (URI)    COLD,  COUGH     Allergies No Known Allergies  IV Location/Drains/Wounds Patient Lines/Drains/Airways Status   Active Line/Drains/Airways    Name:   Placement date:   Placement time:   Site:   Days:   Peripheral IV 03/07/18 Left Antecubital   03/07/18    -    Antecubital   less than 1   Peripheral IV 03/07/18 Left Wrist   03/07/18    1823    Wrist   less than 1          Labs/Imaging Results for orders placed or performed during the hospital encounter of 03/07/18 (from the past 48 hour(s))  Comprehensive metabolic panel     Status: Abnormal   Collection Time: 03/07/18  4:06 PM  Result Value Ref Range   Sodium 139 135 - 145 mmol/L   Potassium 3.2 (L) 3.5 - 5.1 mmol/L   Chloride 103  101 - 111 mmol/L   CO2 24 22 - 32 mmol/L   Glucose, Bld 104 (H) 65 - 99 mg/dL   BUN 5 (L) 6 - 20 mg/dL   Creatinine, Ser 1.10 0.61 - 1.24 mg/dL   Calcium 8.7 (L) 8.9 - 10.3 mg/dL   Total Protein 7.1 6.5 - 8.1 g/dL   Albumin 3.9 3.5 - 5.0 g/dL   AST 21 15 - 41 U/L   ALT 11 (L) 17 - 63 U/L   Alkaline Phosphatase 71 38 - 126 U/L   Total Bilirubin 0.3 0.3 - 1.2 mg/dL   GFR calc non Af Amer >60 >60 mL/min   GFR calc Af Amer >60 >60 mL/min    Comment: (NOTE) The eGFR has been calculated using the CKD EPI equation. This calculation has not been validated in all clinical situations. eGFR's persistently <60 mL/min signify possible Chronic Kidney Disease.    Anion gap 12 5 - 15    Comment: Performed at [facility], [place] [address], [place], [place] [address]  CBC WITH DIFFERENTIAL     Status: Abnormal   Collection Time: 03/07/18  4:06 PM  Result Value Ref Range   WBC 6.3 4.0 -  10.5 K/uL   RBC 2.83 (L) 4.22 - 5.81 MIL/uL   Hemoglobin 5.6 (LL) 13.0 - 17.0 g/dL    Comment: SPECIMEN CHECKED FOR CLOTS REPEATED TO VERIFY CRITICAL RESULT CALLED TO, READ BACK BY AND VERIFIED WITH: Broadus John, MEDINA 03/07/18 BLACK M 4:55    HCT 20.0 (L) 39.0 - 52.0 %   MCV 70.7 (L) 78.0 - 100.0 fL   MCH 19.8 (L) 26.0 - 34.0 pg   MCHC 28.0 (L) 30.0 - 36.0 g/dL   RDW 23.9 (H) 11.5 - 15.5 %   Platelets 125 (L) 150 - 400 K/uL   Neutrophils Relative % 78 %   Lymphocytes Relative 14 %   Monocytes Relative 6 %   Eosinophils Relative 1 %   Basophils Relative 1 %   Neutro Abs 4.8 1.7 - 7.7 K/uL   Lymphs Abs 0.9 0.7 - 4.0 K/uL   Monocytes Absolute 0.4 0.1 - 1.0 K/uL   Eosinophils Absolute 0.1 0.0 - 0.7 K/uL   Basophils Absolute 0.1 0.0 - 0.1 K/uL   RBC Morphology POLYCHROMASIA PRESENT     Comment: TARGET CELLS HOWELL/JOLLY BODIES Performed at [facility], [place] [address] [place], [place] [address]   Urinalysis, Routine w reflex microscopic     Status: None   Collection  Time: 03/07/18  4:06 PM  Result Value Ref Range   Color, Urine YELLOW YELLOW   APPearance CLEAR CLEAR   Specific Gravity, Urine 1.018 1.005 - 1.030   pH 6.0 5.0 - 8.0   Glucose, UA NEGATIVE NEGATIVE mg/dL   Hgb urine dipstick NEGATIVE NEGATIVE   Bilirubin Urine NEGATIVE NEGATIVE   Ketones, ur NEGATIVE NEGATIVE mg/dL   Protein, ur NEGATIVE NEGATIVE mg/dL   Nitrite NEGATIVE NEGATIVE   Leukocytes, UA NEGATIVE NEGATIVE    Comment: Performed at [facility] [address], [place], [place] [address]  I-Stat CG4 Lactic Acid, ED  (not at  [facility])     Status: None   Collection Time: 03/07/18  4:11 PM  Result Value Ref Range   Lactic Acid, Venous 1.02 0.5 - 1.9 mmol/L  I-Stat CG4 Lactic Acid, ED  (not at  [facility])     Status: None   Collection Time: 03/07/18  6:24 PM  Result Value Ref Range   Lactic Acid, Venous 0.91 0.5 - 1.9 mmol/L  POC occult blood, ED RN will collect     Status: None   Collection Time: 03/07/18  6:47 PM  Result Value Ref Range   Fecal Occult Bld NEGATIVE NEGATIVE  Type and screen [place]     Status: None (Preliminary result)   Collection Time: 03/07/18  6:53 PM  Result Value Ref Range   ABO/RH(D) O POS    Antibody Screen PENDING    Sample Expiration      03/10/2018 Performed at [facility], [place] [address], [place], [place] [address]    Dg Chest 2 View  Result Date: 03/07/2018 CLINICAL DATA:  Shortness of breath for the past 2 days. EXAM: CHEST - 2 VIEW COMPARISON:  Chest radiographs and chest CT dated 02/10/2017. FINDINGS: Normal sized heart. Clear lungs. The lungs remain hyperexpanded interval mild central peribronchial thickening. Old, healed right rib fractures. IMPRESSION: 1. Interval mild bronchitic changes. 2. Stable changes of COPD. Electronically Signed   By: Claudie Revering M.D.   On: 03/07/2018 16:14   Ct Soft Tissue Neck W Contrast  Result Date: 03/07/2018 CLINICAL DATA:  62 y/o M; history  of  laryngeal cancer post radiation. Productive cough for 3 months. Presenting with fever and shortness of breath. EXAM: CT NECK WITH CONTRAST TECHNIQUE: Multidetector CT imaging of the neck was performed using the standard protocol following the bolus administration of intravenous contrast. CONTRAST:  55m ISOVUE-300 IOPAMIDOL (ISOVUE-300) INJECTION 61% COMPARISON:  02/10/2017 CT of the neck. FINDINGS: Pharynx and larynx: Stable thickening of left-greater-than-right aryepiglottic folds. No new exophytic mucosal lesion to suggest recurrent or residual disease. Salivary glands: No inflammation, mass, or stone. Thyroid: Normal. Lymph nodes: None enlarged or abnormal density. Vascular: Mild calcific atherosclerosis of carotid bifurcations without significant stenosis. Limited intracranial: Negative. Visualized orbits: Negative. Mastoids and visualized paranasal sinuses: Clear. Skeleton: Moderate cervical spondylosis with predominantly discogenic degenerative changes. C3-C7 uncovertebral and facet hypertrophy encroaches on neural foramen. No high-grade bony canal stenosis. Upper chest: Scarring in the lung apices probably representing a radiation port. Small airway mucous impaction in left upper lobe. Other: None. IMPRESSION: 1. No acute abnormality of the neck identified. 2. Stable thickening of left-greater-than-right aryepiglottic folds likely representing posttreatment changes. No residual/recurrent laryngeal cancer identified. 3. Mild mucous impaction of airways in the left upper lobe. 4. Moderate cervical spine spondylosis. Electronically Signed   By: LKristine GarbeM.D.   On: 03/07/2018 18:16    Pending Labs Unresulted Labs (From admission, onward)   Start     Ordered   03/08/18 0500  Hemoglobin and hematocrit, blood  Tomorrow morning,   R     03/07/18 1924   03/07/18 2000  HIV antibody (Routine Testing)  Once,   R     03/07/18 2000   03/07/18 1924  Vitamin B12  (Anemia Panel (PNL))  Once,   R      03/07/18 1923   03/07/18 1924  Folate  (Anemia Panel (PNL))  Once,   R     03/07/18 1923   03/07/18 1924  Iron and TIBC  (Anemia Panel (PNL))  Once,   R     03/07/18 1923   03/07/18 1924  Ferritin  (Anemia Panel (PNL))  Once,   R     03/07/18 1923   03/07/18 1924  Reticulocytes  (Anemia Panel (PNL))  Once,   R     03/07/18 1923   03/07/18 1543  Blood Culture (routine x 2)  BLOOD CULTURE X 2,   STAT     03/07/18 1543      Vitals/Pain Today's Vitals   03/07/18 1800 03/07/18 1812 03/07/18 1909 03/07/18 1930  BP: (!) 155/84  113/60 (!) 156/90  Pulse: 96  97 94  Resp: (!) 23  17 (!) 21  Temp:      TempSrc:      SpO2: 100%  100% 100%  Weight:  140 lb (63.5 kg)    Height:  _0  (1.727 m)    PainSc:        Isolation Precautions No active isolations  Medications Medications  acetaminophen (TYLENOL) tablet 650 mg (has no administration in time range)    Or  acetaminophen (TYLENOL) suppository 650 mg (has no administration in time range)  ondansetron (ZOFRAN) tablet 4 mg (has no administration in time range)    Or  ondansetron (ZOFRAN) injection 4 mg (has no administration in time range)  iopamidol (ISOVUE-300) 61 % injection (80 mLs Intravenous Contrast Given 03/07/18 1725)  0.9 %  sodium chloride infusion (10 mL/hr Intravenous New Bag/Given 03/07/18 1827)  potassium chloride 20 MEQ/15ML (10%) solution 40 mEq (40 mEq Oral Given 03/07/18 2013)  Mobility walks

## 2018-03-07 NOTE — ED Triage Notes (Signed)
Patient coming from home with c/o SOB. Patient state sob started two days ago. Pt state it happen at rest too. Pt have hx of throat cancer. Pt also c/o productive cough for the past 3 months. Per ems fever 102.0 was given 500 ml of fluids bolus and ibuprofen 800 mg tab.
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
## 2018-03-07 NOTE — ED Notes (Signed)
No respiratory or acute distress noted alert and oriented x 3 clear speech noted no drooling noted able to speak in full sentences visitor at bedside call light in reach.

## 2018-03-08 ENCOUNTER — Observation Stay (HOSPITAL_COMMUNITY): Payer: Medicaid Other

## 2018-03-08 DIAGNOSIS — D649 Anemia, unspecified: Secondary | ICD-10-CM | POA: Diagnosis not present

## 2018-03-08 LAB — IRON AND TIBC
IRON: 11 ug/dL — AB (ref 45–182)
SATURATION RATIOS: 2 % — AB (ref 17.9–39.5)
TIBC: 444 ug/dL (ref 250–450)
UIBC: 433 ug/dL

## 2018-03-08 LAB — PREPARE RBC (CROSSMATCH)

## 2018-03-08 LAB — HEMOGLOBIN AND HEMATOCRIT, BLOOD
HCT: 27.6 % — ABNORMAL LOW (ref 39.0–52.0)
Hemoglobin: 8.4 g/dL — ABNORMAL LOW (ref 13.0–17.0)

## 2018-03-08 LAB — HIV ANTIBODY (ROUTINE TESTING W REFLEX): HIV SCREEN 4TH GENERATION: NONREACTIVE

## 2018-03-08 LAB — FOLATE: FOLATE: 28.8 ng/mL (ref >5.9)

## 2018-03-08 LAB — INFLUENZA PANEL BY PCR (TYPE A & B)
Influenza A By PCR: NEGATIVE
Influenza B By PCR: NEGATIVE

## 2018-03-08 LAB — FERRITIN: Ferritin: 5 ng/mL — ABNORMAL LOW (ref 24–336)

## 2018-03-08 LAB — VITAMIN B12: Vitamin B-12: 457 pg/mL (ref 180–914)

## 2018-03-08 MED ORDER — IOPAMIDOL (ISOVUE-300) INJECTION 61%
INTRAVENOUS | Status: AC
  Administered 2018-03-08: 30 mL via ORAL
  Filled 2018-03-08: qty 30

## 2018-03-08 MED ORDER — IOPAMIDOL (ISOVUE-300) INJECTION 61%
INTRAVENOUS | Status: AC
  Administered 2018-03-08: 100 mL via INTRAVENOUS
  Filled 2018-03-08: qty 100
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
## 2018-03-08 NOTE — Progress Notes (Signed)
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   Assessment & Plan:   Active Problems:   Symptomatic anemia   #) Shortness of breath: Patient reports sensation of his throat closing up as well as episodes of dysphasia.  He has had these for years however this episode was particularly bad.  His CT neck showed chronic thickening however no evidence of acute occlusion at this time.  He does not have any upper respiratory symptoms and otherwise appears to be quite comfortable on room air.  He is reports of a fever during this episode are suggestive of possible pneumonia however chest x-ray shows only changes of COPD and some peribronchial thickening.  He could very well likely have either an atypical pneumonia or a viral bronchitis causing his symptomatology. -Speech-language pathology consult for dysphasia  #) anemia: Patient was found to be anemic to 5.6.  He is also mildly thrombocytopenic to 125.  His white blood cell count and differential are normal.  He does not report any GI losses where he has not had a screening colonoscopy.  Suspect likely hypoproliferative as his MCV is quite low in the setting of chronic GI losses.  Certainly with a history of squamous cell carcinoma of the larynx this does raise the concern whether he has had a recurrence.  He apparently has not been routinely seeing the ear nose and throat doctor for screening. -Transfused 2 units packed red blood cells -Follow-up reticulocyte, ferritin, iron and TIBC, folate, B12 -CT of the abdomen/pelvis  with contrast ordered -We will consider GI consult  #) Squamous cell carcinoma of the larynx status post surgery and chemotherapy: Patient apparently has not been routinely following up with ear nose and throat for surveillance.  It is not clear when he had his last laryngoscopy and pharyngoscopy.  He has had a recent CT of the chest with contrast and a CT soft tissue of the neck with contrast that showed no evidence of recurrent cancer. -CT chest ordered  #) Failure to thrive: Patient has had waxing and waning problems with weight gain.  It is unclear if this is related to poor p.o. intake or his reported dysphasia. -Nutrition consult -Imaging as above to rule out malignancy  #) GERD: -Continue PPI  #) COPD noted on chest x-ray: Patient has not had formal PFTs and will need them as an outpatient.  Strongly urged patient to quit smoking  Fluids: IV fluids Electrolytes: Monitor and supplement Nutrition: Supplemented diet  Prophylaxis: SCDs  Disposition: Pending stabilization of hemoglobin and possible discussion with GI  FULL CODE  Consultants:   none  Procedures: (Don't include imaging studies which can be auto populated. Include things that cannot be auto populated i.e. Echo, Carotid and venous dopplers, Foley, Bipap, HD, tubes/drains, wound vac, central lines etc)  none  Antimicrobials: (specify start and planned stop date. Auto populated tables are space occupying and do not give end dates)  none    Subjective: Patient reports he is doing well.  He no longer has any difficulty breathing.  He continues to report intermittent dysphagia when swallowing.  He denies any nausea, vomiting, diarrhea, hematochezia, melena.  Objective: Vitals:   03/08/18 0315 03/08/18 0640 03/08/18 0830 03/08/18 0901  BP: 136/78 (!) 153/83 (!) 152/83 (!) 143/85  Pulse: 87 87 90 94  Resp: 18 17 18 18   Temp: 98 F (36.7 C) 98.4 F (36.9 C) 98.9 F (37.2 C) (!) 97.5 F (36.4 C)  TempSrc:  Oral Oral Oral Axillary  SpO2: 100% 100% 100% 100%  Weight:      Height:        Intake/Output Summary (Last 24 hours) at 03/08/2018 1122 Last data filed at 03/08/2018 1003 Gross per 24 hour  Intake 800 ml  Output 680 ml  Net 120 ml   Filed Weights   03/07/18 1812  Weight: 63.5 kg (140 lb)    Examination:  General exam: Appears calm and comfortable  Respiratory system: Clear to auscultation. Respiratory effort normal.  Scattered wheezes Cardiovascular system: Regular rate and rhythm, no murmurs. Gastrointestinal system: Scaphoid, soft, nontender, plus bowel sounds Central nervous system: Alert and oriented. No focal neurological deficits. Extremities: No lower extremity edema Skin: Well-healed incision over abdomen Psychiatry: Judgement and insight appear poor.  Mood & affect appropriate.     Data Reviewed: I have personally reviewed following labs and imaging studies  CBC: Recent Labs  Lab 03/07/18 1606  WBC 6.3  NEUTROABS 4.8  HGB 5.6*  HCT 20.0*  MCV 70.7*  PLT 517*   Basic Metabolic Panel: Recent Labs  Lab 03/07/18 1606  NA 139  K 3.2*  CL 103  CO2 24  GLUCOSE 104*  BUN 5*  CREATININE 1.10  CALCIUM 8.7*   GFR: Estimated Creatinine Clearance: 61.7 mL/min (by C-G formula based on SCr of 1.1 mg/dL). Liver Function Tests: Recent Labs  Lab 03/07/18 1606  AST 21  ALT 11*  ALKPHOS 71  BILITOT 0.3  PROT 7.1  ALBUMIN 3.9   No results for input(s): LIPASE, AMYLASE in the last 168 hours. No results for input(s): AMMONIA in the last 168 hours. Coagulation Profile: No results for input(s): INR, PROTIME in the last 168 hours. Cardiac Enzymes: No results for input(s): CKTOTAL, CKMB, CKMBINDEX, TROPONINI in the last 168 hours. BNP (last 3 results) No results for input(s): PROBNP in the last 8760 hours. HbA1C: No results for input(s): HGBA1C in the last 72 hours. CBG: No results for input(s): GLUCAP in the last 168 hours. Lipid Profile: No results  for input(s): CHOL, HDL, LDLCALC, TRIG, CHOLHDL, LDLDIRECT in the last 72 hours. Thyroid Function Tests: No results for input(s): TSH, T4TOTAL, FREET4, T3FREE, THYROIDAB in the last 72 hours. Anemia Panel: Recent Labs    03/07/18 2312  RETICCTPCT 1.5   Sepsis Labs: Recent Labs  Lab 03/07/18 1611 03/07/18 1824  LATICACIDVEN 1.02 0.91    No results found for this or any previous visit (from the past 240 hour(s)).       Radiology Studies: Dg Chest 2 View  Result Date: 03/07/2018 CLINICAL DATA:  Shortness of breath for the past 2 days. EXAM: CHEST - 2 VIEW COMPARISON:  Chest radiographs and chest CT dated 02/10/2017. FINDINGS: Normal sized heart. Clear lungs. The lungs remain hyperexpanded interval mild central peribronchial thickening. Old, healed right rib fractures. IMPRESSION: 1. Interval mild bronchitic changes. 2. Stable changes of COPD. Electronically Signed   By: Claudie Revering M.D.   On: 03/07/2018 16:14   Ct Soft Tissue Neck W Contrast  Result Date: 03/07/2018 CLINICAL DATA:  62 y/o M; history of laryngeal cancer post radiation.  Productive cough for 3 months. Presenting with fever and shortness of breath. EXAM: CT NECK WITH CONTRAST TECHNIQUE: Multidetector CT imaging of the neck was performed using the standard protocol following the bolus administration of intravenous contrast. CONTRAST:  16mL ISOVUE-300 IOPAMIDOL (ISOVUE-300) INJECTION 61% COMPARISON:  02/10/2017 CT of the neck. FINDINGS: Pharynx and larynx: Stable thickening of left-greater-than-right aryepiglottic folds. No new exophytic mucosal lesion to suggest recurrent or residual disease. Salivary glands: No inflammation, mass, or stone. Thyroid: Normal. Lymph nodes: None enlarged or abnormal density. Vascular: Mild calcific atherosclerosis of carotid bifurcations without significant stenosis. Limited intracranial: Negative. Visualized orbits: Negative. Mastoids and visualized paranasal sinuses: Clear. Skeleton: Moderate  cervical spondylosis with predominantly discogenic degenerative changes. C3-C7 uncovertebral and facet hypertrophy encroaches on neural foramen. No high-grade bony canal stenosis. Upper chest: Scarring in the lung apices probably representing a radiation port. Small airway mucous impaction in left upper lobe. Other: None. IMPRESSION: 1. No acute abnormality of the neck identified. 2. Stable thickening of left-greater-than-right aryepiglottic folds likely representing posttreatment changes. No residual/recurrent laryngeal cancer identified. 3. Mild mucous impaction of airways in the left upper lobe. 4. Moderate cervical spine spondylosis. Electronically Signed   By: Kristine Garbe M.D.   On: 03/07/2018 18:16        Scheduled Meds: . feeding supplement  1 Container Oral TID BM  . multivitamin with minerals  1 tablet Oral Daily  . pantoprazole  40 mg Oral Daily   Continuous Infusions:   LOS: 0 days    Time spent: [place], MD [facility]   If 7PM-7AM, please contact night-coverage [email or web address] Password [facility] 03/08/2018, 11:22 AM

## 2018-03-09 ENCOUNTER — Observation Stay (HOSPITAL_COMMUNITY): Payer: Medicaid Other

## 2018-03-09 ENCOUNTER — Telehealth: Payer: Self-pay

## 2018-03-09 DIAGNOSIS — D649 Anemia, unspecified: Secondary | ICD-10-CM | POA: Diagnosis not present

## 2018-03-09 LAB — BASIC METABOLIC PANEL
BUN: <5 mg/dL — ABNORMAL LOW (ref 6–20)
CO2: 26 mmol/L (ref 22–32)
Chloride: 105 mmol/L (ref 101–111)
Creatinine, Ser: 0.95 mg/dL (ref 0.61–1.24)
Glucose, Bld: 94 mg/dL (ref 65–99)
Sodium: 140 mmol/L (ref 135–145)

## 2018-03-09 LAB — BASIC METABOLIC PANEL WITH GFR
Anion gap: 9 (ref 5–15)
Calcium: 8.8 mg/dL — ABNORMAL LOW (ref 8.9–10.3)
GFR calc Af Amer: >60 mL/min (ref >60)
GFR calc non Af Amer: >60 mL/min (ref >60)
Potassium: 3.5 mmol/L (ref 3.5–5.1)

## 2018-03-09 LAB — CBC
HCT: 28.9 % — ABNORMAL LOW (ref 39.0–52.0)
Hemoglobin: 8.5 g/dL — ABNORMAL LOW (ref 13.0–17.0)
MCH: 22.4 pg — ABNORMAL LOW (ref 26.0–34.0)
MCHC: 29.4 g/dL — ABNORMAL LOW (ref 30.0–36.0)
MCV: 76.3 fL — ABNORMAL LOW (ref 78.0–100.0)
Platelets: 160 K/uL (ref 150–400)
RBC: 3.79 MIL/uL — ABNORMAL LOW (ref 4.22–5.81)
RDW: 25.7 % — ABNORMAL HIGH (ref 11.5–15.5)
WBC: 5.3 K/uL (ref 4.0–10.5)

## 2018-03-09 LAB — MAGNESIUM: Magnesium: 1.4 mg/dL — ABNORMAL LOW (ref 1.7–2.4)

## 2018-03-09 MED ORDER — ENSURE ENLIVE PO LIQD: 237.0000 mL | ORAL | Status: DC

## 2018-03-09 MED ORDER — MAGNESIUM SULFATE 4 GM/100ML IV SOLN
4.0000 g | Freq: Once | INTRAVENOUS | Status: AC
  Administered 2018-03-09: 4 g via INTRAVENOUS
  Filled 2018-03-09: qty 100

## 2018-03-09 MED ORDER — GADOBENATE DIMEGLUMINE 529 MG/ML IV SOLN
15.0000 mL | Freq: Once | INTRAVENOUS | Status: AC | PRN
  Administered 2018-03-09: 13 mL via INTRAVENOUS

## 2018-03-09 MED ORDER — FERROUS SULFATE 325 (65 FE) MG PO TABS: 325.0000 mg | ORAL_TABLET | Freq: Every day | ORAL | Status: DC

## 2018-03-09 MED ORDER — FERROUS SULFATE 325 (65 FE) MG PO TABS: 325.0000 mg | ORAL_TABLET | Freq: Every day | ORAL | 3 refills | Status: DC

## 2018-03-09 MED ORDER — PANTOPRAZOLE SODIUM 40 MG PO TBEC: 40.0000 mg | DELAYED_RELEASE_TABLET | Freq: Every day | ORAL | 0 refills | Status: DC

## 2018-03-09 NOTE — Telephone Encounter (Signed)
-----   Message from Loralie Champagne, PA-C sent at 03/08/2018 11:55 AM EDT ----- Patient admitted to the hospital for symptomatic anemia.  Hemoccult negative and no overt bleeding but likely needs EGD and colonoscopy.  Can you please schedule him to see one of the APP's within the next 7-10 days?  Patient will need to be made aware of the appt and he will be a new patient as he was not seen in consult in the hospital.  Thank you,  Jess

## 2018-03-09 NOTE — Clinical Social Work Note (Signed)
Clinical Social Work Assessment  Patient Details  Name: Eric Dorsey MRN: [id number] Date of Birth: 11/28/1956  Date of referral:  03/09/18               Reason for consult:  Transportation                Permission sought to share information with:    Permission granted to share information::     Name::        Agency::     Relationship::     Contact Information:     Housing/Transportation Living arrangements for the past 2 months:  Single Family Home Source of Information:  Patient Patient Interpreter Needed:  None Criminal Activity/Legal Involvement Pertinent to Current Situation/Hospitalization:  No - Comment as needed Significant Relationships:  Other Family Members Lives with:  Self Do you feel safe going back to the place where you live?  Yes Need for family participation in patient care:  No (Coment)  Care giving concerns:  Patient from home alone. Patient reported no care giving concerns. CSW consulted for transportation needs.   Social Worker assessment / plan:  CSW spoke with patient at bedside regarding consult for transportation needs. Patient accompanied by niece and verbalized permission for his niece to stay in the room while CSW spoke with patient. CSW inquired about patient's previous mode of transportation. Patient reported that he got rides from neighbors and family members. CSW inquired about patient's ability to ride the bus, patient reported that he is able but there is no bus near his home in [place]. CSW provided patient with [employer] resource and [employer]) resource and informed patient about both services. CSW encouraged patient to follow up with resources to set up transportation services. CSW inquired if patient had any additional questions/needs, patient reported none. CSW inquired about patient's ride home, patient's niece reported that she will transport patient home.  CSW signing off, no other needs identified at this  time.  Employment status:  Unemployed Forensic scientist:  Medicaid In [place] PT Recommendations:  Not assessed at this time Information / Referral to community resources:  Other (Comment Required)(Transportation resources)  Patient/Family's Response to care:  Patient appreciative of resources provided by CSW.   Patient/Family's Understanding of and Emotional Response to Diagnosis, Current Treatment, and Prognosis:  Patient presented calm and verbalized plan to dc home with transport from niece. CSW provided patient with transportation resources.   Emotional Assessment Appearance:  Appears stated age Attitude/Demeanor/Rapport:  Other(Cooperative) Affect (typically observed):  Calm Orientation:  Oriented to Self, Oriented to Situation, Oriented to Place, Oriented to  Time Alcohol / Substance use:  Not Applicable Psych involvement (Current and /or in the community):  No (Comment)  Discharge Needs  Concerns to be addressed:  No discharge needs identified Readmission within the last 30 days:  No Current discharge risk:  None Barriers to Discharge:      Burnis Medin, LCSW 03/09/2018, 12:23 PM

## 2018-03-09 NOTE — Progress Notes (Signed)
  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  Provided pt with tips for massage/moist heat/stretches prior to exercises to loosen tissue.  In addition mandibular ROM, lingual press and effortful swallow for increased muscle fiber recruitment provided.  Pt reports problems with refluxing and states he was on a reflux medication but it "ran out" and since then his refluxing is worse.  He does admit to solid food more than liquid dysphagia - and states at times he must "go outside to cough it up"  = suspect pharyngeal residuals.  Informed him to importance of maintaining base of tongue strength to aid vallecular residuals.  Reviewed pH neutral status of water making it safest drink to consume - using teach back.  Pt advised he will continue exercises to ward off tissue fibrosis as much as able.   No follow up indicated as pt education completed and pt able to conduct independently.     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 CT Neck 03/07/18 noted for stable thickening of left-greater-than-right aryepiglottic folds likely representing posttreatment changes.  Pt reports more problems swallowing liquids than foods. MBS indicated due to pt dysphagia to aid in most appropriate dietary recommendations  and compensation strategies.        SLP Plan  All goals met       Recommendations  Diet recommendations: Regular;Thin liquid Liquids provided via: Cup Medication Administration: Whole meds with puree(start and follow with liquids) Compensations: Slow rate;Small sips/bites;Follow solids with liquid;Effortful swallow Postural Changes and/or Swallow Maneuvers: Seated upright 90 degrees;Upright 30-60 min after meal                Oral Care Recommendations: Oral care BID Follow up Recommendations: Other (comment)(tbd) SLP Visit Diagnosis: Dysphagia, unspecified (R13.10) Plan: All goals met       GO              Luanna Salk, MS [facility] SLP [phone]   Macario Golds 03/09/2018, 10:03 AM

## 2018-03-09 NOTE — Progress Notes (Signed)
Modified Barium Swallow Progress Note  Patient Details  Name: Eric Dorsey MRN: [id number] Date of Birth: [birth year]  Today's Date: 03/09/2018  Modified Barium Swallow completed.  Full report located under Chart Review in the Imaging Section.  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  Chin tuck posture worsened airway protection as it dumped liquid from pyriform sinus into airway.    Pt largely senses residauls and thus conducts dry swallows to aid clearance.  Pharyngeal residuals worse with solids/increased viscocity than liquids.  Cued effortful swallow and following solids with liquids helpful to decrease residuals.    Barium tablet given with pudding cleared through pharynx easily- with pharyngeal residuals of pudding that pt cleared with liquid and dry swallows.  Super supraglottic swallow attempted to aid airway protection in future if needed.  Expectoration of barium from vallecular region was successful with verbal cues to "hock".    Using live video, educated pt to findings/recommendations.  Recommend pt have diet as tolerated given his awareness to his deficits.     Swallow Evaluation Recommendations       SLP Diet Recommendations: Regular solids;Thin liquid   Liquid Administration via: Cup;Straw   Medication Administration: Whole meds with puree(start and follow with liquids)   Supervision: Patient able to self feed   Compensations: Slow rate;Small sips/bites;Multiple dry swallows after each bite/sip;Follow solids with liquid;Other (Comment)("hock" at end of meal)   Postural Changes: Seated upright at 90 degrees;Remain semi-upright after after feeds/meals (Comment)   Oral Care Recommendations: Oral care BID         Macario Golds 03/09/2018,9:21 AM

## 2018-03-09 NOTE — Care Management Note (Signed)
Case Management Note  Patient Details  Name: Eric Dorsey MRN: [id number] Date of Birth: 03-10-1956  Subjective/Objective: Patient has medicaid & has an assigned pcp-informed to contact his DSS case worker. No further CM needs.                   Action/Plan:d/c home.   Expected Discharge Date:  03/09/18               Expected Discharge Plan:  Home/Self Care  In-House Referral:     Discharge planning Services  CM Consult  Post Acute Care Choice:    Choice offered to:     DME Arranged:    DME Agency:     HH Arranged:    HH Agency:     Status of Service:  Completed, signed off  If discussed at [employer] of Stay Meetings, dates discussed:    Additional Comments:  Dessa Phi, RN 03/09/2018, 1:55 PM

## 2018-03-09 NOTE — Discharge Summary (Signed)
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 (Preliminary result)   Collection Time: 03/07/18  4:06 PM  Result Value Ref Range Status   Specimen Description   Final    BLOOD LEFT ANTECUBITAL Performed at [facility] [address], [place], [place] [address]    Special Requests   Final    BOTTLES DRAWN AEROBIC AND ANAEROBIC Blood Culture adequate volume Performed at [facility] [address], [place], [place] [address]    Culture   Final    NO GROWTH < 24 HOURS Performed at [facility] [address], [place], [place] [address]    Report Status PENDING  Incomplete  Blood Culture (routine x 2)     Status: None (Preliminary result)   Collection Time: 03/07/18  4:06 PM  Result Value Ref Range Status   Specimen Description   Final    BLOOD BLOOD RIGHT FOREARM Performed at [facility] [address], [place], [place] [address]    Special Requests   Final    BOTTLES DRAWN AEROBIC AND ANAEROBIC Blood Culture results may not be optimal due to an excessive volume of blood received in culture bottles Performed at [facility] [address], [place], [place] [address]    Culture   Final    NO GROWTH < 24  HOURS Performed at [facility] [address], [place], [place] [address]    Report Status PENDING  Incomplete     Labs: BNP (last 3 results) No results for input(s): BNP in the last 8760 hours. Basic Metabolic Panel: Recent Labs  Lab 03/07/18 1606 03/09/18 0413  NA 139 140  K 3.2* 3.5  CL 103 105  CO2 24 26  GLUCOSE 104* 94  BUN 5* <5*  CREATININE 1.10 0.95  CALCIUM 8.7* 8.8*  MG  --  1.4*   Liver Function Tests: Recent Labs  Lab 03/07/18 1606  AST 21  ALT 11*  ALKPHOS 71  BILITOT 0.3  PROT 7.1  ALBUMIN 3.9   No results for input(s): LIPASE, AMYLASE in the last 168 hours. No results for input(s): AMMONIA in the last 168 hours. CBC: Recent Labs  Lab 03/07/18 1606 03/08/18 1324 03/09/18 0413  WBC 6.3  --  5.3  NEUTROABS 4.8  --   --   HGB 5.6* 8.4* 8.5*  HCT 20.0* 27.6* 28.9*  MCV 70.7*  --  76.3*  PLT 125*  --  160   Cardiac Enzymes: No results for input(s): CKTOTAL, CKMB, CKMBINDEX, TROPONINI in the last 168 hours. BNP: Invalid input(s): POCBNP CBG: No results for input(s): GLUCAP in the last 168 hours. D-Dimer No results for input(s): DDIMER in the last 72 hours. Hgb A1c No results for input(s): HGBA1C in the last 72 hours. Lipid Profile No results for input(s): CHOL, HDL, LDLCALC, TRIG, CHOLHDL, LDLDIRECT in the last 72 hours. Thyroid function studies No results for input(s): TSH, T4TOTAL, T3FREE, THYROIDAB in the last 72 hours.  Invalid input(s): FREET3 Anemia work up Recent Labs    03/07/18 2312  VITAMINB12 457  FOLATE 28.8  FERRITIN 5*  TIBC 444  IRON 11*  RETICCTPCT 1.5   Urinalysis    Component Value Date/Time   COLORURINE YELLOW 03/07/2018 [facility] 03/07/2018 1606  LABSPEC 1.018 03/07/2018 1606   PHURINE 6.0 03/07/2018 1606   GLUCOSEU NEGATIVE 03/07/2018 1606   HGBUR NEGATIVE 03/07/2018 1606   BILIRUBINUR NEGATIVE 03/07/2018 1606   KETONESUR NEGATIVE 03/07/2018 1606   PROTEINUR NEGATIVE  03/07/2018 1606   NITRITE NEGATIVE 03/07/2018 1606   LEUKOCYTESUR NEGATIVE 03/07/2018 1606   Sepsis Labs Invalid input(s): PROCALCITONIN,  WBC,  LACTICIDVEN Microbiology Recent Results (from the past 240 hour(s))  Blood Culture (routine x 2)     Status: None (Preliminary result)   Collection Time: 03/07/18  4:06 PM  Result Value Ref Range Status   Specimen Description   Final    BLOOD LEFT ANTECUBITAL Performed at [facility], [place] [address] [place], [place] [address]    Special Requests   Final    BOTTLES DRAWN AEROBIC AND ANAEROBIC Blood Culture adequate volume Performed at [facility] [address], [place], [place] [address]    Culture   Final    NO GROWTH < 24 HOURS Performed at [facility] [address], [place], [place] [address]    Report Status PENDING  Incomplete  Blood Culture (routine x 2)     Status: None (Preliminary result)   Collection Time: 03/07/18  4:06 PM  Result Value Ref Range Status   Specimen Description   Final    BLOOD BLOOD RIGHT FOREARM Performed at [facility] [address] [place], [place] [address]    Special Requests   Final    BOTTLES DRAWN AEROBIC AND ANAEROBIC Blood Culture results may not be optimal due to an excessive volume of blood received in culture bottles Performed at [facility] [address] [place], [place] [address]    Culture   Final    NO GROWTH < 24 HOURS Performed at [facility] [address], [place],  [address]    Report Status PENDING  Incomplete     Time coordinating discharge: Over 30 minutes  SIGNED:   Cristy Folks, MD  [facility] 03/09/2018, 12:36 PM  If 7PM-7AM, please contact night-coverage [email or web address] Password [email or web address]

## 2018-03-09 NOTE — Progress Notes (Signed)
Pt to be discharged to home this afternoon. Pt given discharge instructions including medication teaching and schedules. Pt verbalized understanding of all discharge teaching/instructions. Prescriptions and discharge packet with Pt at time of discharge.

## 2018-03-09 NOTE — Progress Notes (Signed)
Initial Nutrition Assessment  DOCUMENTATION CODES:   Not applicable  INTERVENTION:  - Will d/c Boost Breeze. - Will order Ensure Enlive po BID, each supplement provides 350 kcal and 20 grams of protein - Continue to encourage PO intakes. - Will monitor for any additional nutrition-related needs.   NUTRITION DIAGNOSIS:   Inadequate oral intake related to dysphagia, acute illness as evidenced by per patient/family report.  GOAL:   Patient will meet greater than or equal to 90% of their needs  MONITOR:   PO intake, Supplement acceptance, Weight trends, Labs  REASON FOR ASSESSMENT:   Malnutrition Screening Tool  ASSESSMENT:   62 y.o. male with medical history significant of throat cancer and who last chemotherapy several years ago presented with acute onset of trouble breathing. He stated that it felt as if his throat closed off for several seconds when he was trying to eat. He denies any aspiration. He has had increasing cough for several days. EMS was called and patient was noted to have temperature of 102 degrees F.  The patient denied any recent fever but does note chronic chills.    BMI indicates normal weight. Pt was on CLD yesterday and then advanced to Dysphagia 3, thin liquids at 6:05 PM. He consumed 100% of trays while on CLD without issue. SLP following and last saw pt today around 9:00 AM for MBS. Recommendation following that study was for regular, thin liquids.   Boost Breeze was ordered while pt was on CLD. He accepted 2 and refused 2. Will adjust supplement order now that diet advanced to maximize nutrition.  Pt reports good appetite but that intakes decrease when he has episodes similar to presenting complaints. He intermittently feels like his throat is "closing off" and it make it difficult for him to try to swallow items other than liquids or items that are very soft.   Anticipate that intakes will improve now that MBS completed/medical management available. Pt  feels that with periods of decreased intakes he has lost weight. Per chart review, he has lost 5 lbs (3.4% body weight) in the past 8 months. This is not significant for time frame. Unable to determine if weight loss occurred more acutely.   Medications reviewed; 4 g IV Mg sulfate x1 run today, daily multivitamin with minerals.  Labs reviewed; BUN: <5 mg/dL, Ca: 8.8 mg/dL, Mg: 1.4 mg/dL.        NUTRITION - FOCUSED PHYSICAL EXAM:  Completed/assessed with no muscle and no fat wasting noted.   Diet Order:  DIET DYS 3 Room service appropriate? Yes; Fluid consistency: Thin  EDUCATION NEEDS:   No education needs have been identified at this time  Skin:  Skin Assessment: Reviewed RN Assessment  Last BM:  3/24  Height:   Ht Readings from Last 1 Encounters:  03/07/18 5\' 8"  (1.727 m)    Weight:   Wt Readings from Last 1 Encounters:  03/07/18 140 lb (63.5 kg)    Ideal Body Weight:  70 kg  BMI:  Body mass index is 21.29 kg/m.  Estimated Nutritional Needs:   Kcal:  1775-1970 (28-31 kcal/kg)  Protein:  70-80 grams  Fluid:  >/= 2 L/day      Jarome Matin, MS, RD, LDN, [facility] Inpatient Clinical Dietitian Pager # [phone] After hours/weekend pager # [phone]

## 2018-03-09 NOTE — Discharge Instructions (Signed)
Iron tablets, capsules, extended-release tablets What is this medicine? IRON (AHY ern) replaces iron that is essential to healthy red blood cells. Iron is used to treat iron deficiency anemia. Anemia may cause problems like tiredness, shortness of breath, or slowed growth in children. Only take iron if your doctor has told you to. Do not treat yourself with iron if you are feeling tired. Most healthy people get enough iron in their diets, particularly if they eat cereals, meat, poultry, and fish. This medicine may be used for other purposes; ask your health care provider or pharmacist if you have questions. COMMON BRAND NAME(S): Bifera, Duofer, Feosol, Feosol Complete, [employer], [place], [place]rike, [place], [place], Ferrimin, Ferro-Sequels, Ferrocite, Hemocyte, Nephro-Fer, NovaFerrum, ProFe, Proferrin ES, Slow Fe, Slow Iron, Tandem What should I tell my health care provider before I take this medicine? They need to know if you have any of these conditions: -frequently drink alcohol -bowel disease -hemolytic anemia -iron overload (hemochromatosis, hemosiderosis) -liver disease -problems with swallowing -stomach ulcer or other stomach problems -an unusual or allergic reaction to iron, other medicines, foods, dyes, or preservatives -pregnant or trying to get pregnant -breast-feeding How should I use this medicine? Take this medicine by mouth with a glass of water or fruit juice. Follow the directions on the prescription label. Swallow whole. Do not crush or chew. Take this medicine in an upright or sitting position. Try to take any bedtime doses at least 10 minutes before lying down. You may take this medicine with food. Take your medicine at regular intervals. Do not take your medicine more often than directed. Do not stop taking except on your doctor's advice. Talk to your pediatrician regarding the use of this medicine in children. While this drug may be prescribed for selected  conditions, precautions do apply. Overdosage: If you think you have taken too much of this medicine contact a poison control center or emergency room at once. NOTE: This medicine is only for you. Do not share this medicine with others. What if I miss a dose? If you miss a dose, take it as soon as you can. If it is almost time for your next dose, take only that dose. Do not take double or extra doses. What may interact with this medicine? If you are taking this iron product, you should not take iron in any other medicine or dietary supplement. This medicine may also interact with the following medications: -alendronate -antacids -cefdinir -chloramphenicol -cholestyramine -deferoxamine -dimercaprol -etidronate -medicines for stomach ulcers or other stomach problems -pancreatic enzymes -quinolone antibiotics (examples: Cipro, Floxin, Levaquin, Tequin and others) -risedronate -tetracycline antibiotics (examples: doxycycline, tetracycline, minocycline, and others) -thyroid hormones This list may not describe all possible interactions. Give your health care provider a list of all the medicines, herbs, non-prescription drugs, or dietary supplements you use. Also tell them if you smoke, drink alcohol, or use illegal drugs. Some items may interact with your medicine. What should I watch for while using this medicine? Use iron supplements only as directed by your health care professional. Dennis Bast will need important blood work while you are taking this medicine. It may take 3 to 6 months of therapy to treat low iron levels. Pregnant women should follow the dose and length of iron treatment as directed by their doctors. Do not use iron longer than prescribed, and do not take a higher dose than recommended. Long-term use may cause excess iron to build-up in the body. Do not take iron with antacids. If you need to take  an antacid, take it 2 hours after a dose of iron. What side effects may I notice from  receiving this medicine? Side effects that you should report to your doctor or health care professional as soon as possible: -allergic reactions like skin rash, itching or hives, swelling of the face, lips, or tongue -blue lips, nails, or palms -dark colored stools (this may be due to the iron, but can indicate a more serious condition) -drowsiness -pain with or difficulty swallowing -pale or clammy skin -seizures -stomach pain -unusually weak or tired -vomiting -weak, fast, or irregular heartbeat Side effects that usually do not require medical attention (report to your doctor or health care professional if they continue or are bothersome): -constipation -indigestion -nausea or stomach upset This list may not describe all possible side effects. Call your doctor for medical advice about side effects. You may report side effects to FDA at 1-800-FDA-1088. Where should I keep my medicine? Keep out of the reach of children. Even small amounts of iron can be harmful to a child. Store at room temperature between 15 and 30 degrees C (59 and 86 degrees F). Keep container tightly closed. Throw away any unused medicine after the expiration date. NOTE: This sheet is a summary. It may not cover all possible information. If you have questions about this medicine, talk to your doctor, pharmacist, or health care provider.  2018 Elsevier/Gold Standard (2008-04-19 17:03:41)

## 2018-03-09 NOTE — Telephone Encounter (Signed)
Pt scheduled to see Eric Savoy NP 03/18/18@2 :30pm. Spoke with pt and he is aware of appt date and time.

## 2018-03-11 LAB — TYPE AND SCREEN
ABO/RH(D): O POS
ANTIBODY SCREEN: POSITIVE
PT AG TYPE: NEGATIVE
UNIT DIVISION: 0
UNIT DIVISION: 0
Unit division: 0
Unit division: 0

## 2018-03-11 LAB — BPAM RBC
BLOOD PRODUCT EXPIRATION DATE: 201904102359
BLOOD PRODUCT EXPIRATION DATE: 201904252359
Blood Product Expiration Date: 201904042359
Blood Product Expiration Date: 201904162359
ISSUE DATE / TIME: 201903240257
ISSUE DATE / TIME: 201903240838
UNIT TYPE AND RH: 5100
UNIT TYPE AND RH: 5100
Unit Type and Rh: 5100
Unit Type and Rh: 9500

## 2018-03-12 LAB — CULTURE, BLOOD (ROUTINE X 2)
Culture: NO GROWTH
Culture: NO GROWTH
SPECIAL REQUESTS: ADEQUATE

## 2018-03-18 ENCOUNTER — Encounter: Payer: Self-pay | Admitting: Nurse Practitioner

## 2018-03-18 ENCOUNTER — Ambulatory Visit: Payer: Medicaid Other | Admitting: Nurse Practitioner

## 2018-03-18 VITALS — BP 150/80 | HR 86 | Ht 67.5 in | Wt 137.0 lb

## 2018-03-18 DIAGNOSIS — D509 Iron deficiency anemia, unspecified: Secondary | ICD-10-CM | POA: Diagnosis not present

## 2018-03-18 DIAGNOSIS — K219 Gastro-esophageal reflux disease without esophagitis: Secondary | ICD-10-CM | POA: Diagnosis not present

## 2018-03-18 DIAGNOSIS — R195 Other fecal abnormalities: Secondary | ICD-10-CM

## 2018-03-18 MED ORDER — SUPREP BOWEL PREP KIT 17.5-3.13-1.6 GM/177ML PO SOLN: 1.0000 | ORAL | 0 refills | Status: DC

## 2018-03-18 NOTE — Patient Instructions (Addendum)
Normal BMI (Body Mass Index- based on height and weight) is between 19 and 25. Your BMI today is Body mass index is 21.14 kg/m. Eric Dorsey Please consider follow up  regarding your BMI with your Primary Care Provider.  You have been scheduled for an endoscopy and colonoscopy. Please follow the written instructions given to you at your visit today. Please pick up your prep supplies at the pharmacy within the next 1-3 days. If you use inhalers (even only as needed), please bring them with you on the day of your procedure. Your physician has requested that you go to www.startemmi.com and enter the access code given to you at your visit today. This web site gives a general overview about your procedure. However, you should still follow specific instructions given to you by our office regarding your preparation for the procedure.    Please hold your Iron 5 days prior to procedure
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
## 2018-03-23 ENCOUNTER — Encounter: Payer: Self-pay | Admitting: Nurse Practitioner

## 2018-04-22 ENCOUNTER — Ambulatory Visit (AMBULATORY_SURGERY_CENTER): Payer: Medicaid Other | Admitting: Internal Medicine

## 2018-04-22 ENCOUNTER — Other Ambulatory Visit (INDEPENDENT_AMBULATORY_CARE_PROVIDER_SITE_OTHER): Payer: Medicaid Other

## 2018-04-22 ENCOUNTER — Other Ambulatory Visit: Payer: Self-pay

## 2018-04-22 ENCOUNTER — Encounter: Payer: Self-pay | Admitting: Internal Medicine

## 2018-04-22 VITALS — BP 132/96 | HR 98 | Temp 99.6°F | Resp 16 | Ht 67.5 in | Wt 137.0 lb

## 2018-04-22 DIAGNOSIS — D123 Benign neoplasm of transverse colon: Secondary | ICD-10-CM

## 2018-04-22 DIAGNOSIS — K2980 Duodenitis without bleeding: Secondary | ICD-10-CM | POA: Diagnosis not present

## 2018-04-22 DIAGNOSIS — D509 Iron deficiency anemia, unspecified: Secondary | ICD-10-CM

## 2018-04-22 DIAGNOSIS — K2950 Unspecified chronic gastritis without bleeding: Secondary | ICD-10-CM | POA: Diagnosis not present

## 2018-04-22 DIAGNOSIS — D120 Benign neoplasm of cecum: Secondary | ICD-10-CM

## 2018-04-22 LAB — CBC WITH DIFFERENTIAL/PLATELET
BASOS ABS: 0.1 10*3/uL (ref 0.0–0.1)
Basophils Relative: 1.3 % (ref 0.0–3.0)
EOS ABS: 0.1 10*3/uL (ref 0.0–0.7)
Eosinophils Relative: 1 % (ref 0.0–5.0)
HEMATOCRIT: 31.4 % — AB (ref 39.0–52.0)
Hemoglobin: 10 g/dL — ABNORMAL LOW (ref 13.0–17.0)
LYMPHS PCT: 17.8 % (ref 12.0–46.0)
Lymphs Abs: 0.9 10*3/uL (ref 0.7–4.0)
MCHC: 31.8 g/dL (ref 30.0–36.0)
MCV: 81.3 fl (ref 78.0–100.0)
MONOS PCT: 10.4 % (ref 3.0–12.0)
Monocytes Absolute: 0.5 10*3/uL (ref 0.1–1.0)
NEUTROS ABS: 3.6 10*3/uL (ref 1.4–7.7)
NEUTROS PCT: 69.5 % (ref 43.0–77.0)
PLATELETS: 359 10*3/uL (ref 150.0–400.0)
RBC: 3.86 Mil/uL — AB (ref 4.22–5.81)
RDW: 26.4 % — ABNORMAL HIGH (ref 11.5–15.5)
WBC: 5.2 10*3/uL (ref 4.0–10.5)

## 2018-04-22 LAB — FERRITIN: FERRITIN: 11.4 ng/mL — AB (ref 22.0–322.0)

## 2018-04-22 LAB — IBC PANEL
Iron: 30 ug/dL — ABNORMAL LOW (ref 42–165)
SATURATION RATIOS: 7.2 % — AB (ref 20.0–50.0)
TRANSFERRIN: 297 mg/dL (ref 212.0–360.0)

## 2018-04-22 MED ORDER — SODIUM CHLORIDE 0.9 % IV SOLN: 500.0000 mL | Freq: Once | INTRAVENOUS | Status: DC

## 2018-04-22 NOTE — Progress Notes (Signed)
A/ox3 pleased with MAC, report to [employer]

## 2018-04-22 NOTE — Patient Instructions (Signed)
YOU HAD AN ENDOSCOPIC PROCEDURE TODAY AT [facility]:   Refer to the procedure report that was given to you for any specific questions about what was found during the examination.  If the procedure report does not answer your questions, please call your gastroenterologist to clarify.  If you requested that your care partner not be given the details of your procedure findings, then the procedure report has been included in a sealed envelope for you to review at your convenience later.  YOU SHOULD EXPECT: Some feelings of bloating in the abdomen. Passage of more gas than usual.  Walking can help get rid of the air that was put into your GI tract during the procedure and reduce the bloating. If you had a lower endoscopy (such as a colonoscopy or flexible sigmoidoscopy) you may notice spotting of blood in your stool or on the toilet paper. If you underwent a bowel prep for your procedure, you may not have a normal bowel movement for a few days.  Please Note:  You might notice some irritation and congestion in your nose or some drainage.  This is from the oxygen used during your procedure.  There is no need for concern and it should clear up in a day or so.  SYMPTOMS TO REPORT IMMEDIATELY:   Following lower endoscopy (colonoscopy or flexible sigmoidoscopy):  Excessive amounts of blood in the stool  Significant tenderness or worsening of abdominal pains  Swelling of the abdomen that is new, acute  Fever of 100F or higher   Following upper endoscopy (EGD)  Vomiting of blood or coffee ground material  New chest pain or pain under the shoulder blades  Painful or persistently difficult swallowing  New shortness of breath  Fever of 100F or higher  Black, tarry-looking stools  For urgent or emergent issues, a gastroenterologist can be reached at any hour by calling [phone].   DIET:  We do recommend a small meal at first, but then you may proceed to your regular diet.  Drink  plenty of fluids but you should avoid alcoholic beverages for 24 hours.  ACTIVITY:  You should plan to take it easy for the rest of today and you should NOT DRIVE or use heavy machinery until tomorrow (because of the sedation medicines used during the test).    FOLLOW UP: Our staff will call the number listed on your records the next business day following your procedure to check on you and address any questions or concerns that you may have regarding the information given to you following your procedure. If we do not reach you, we will leave a message.  However, if you are feeling well and you are not experiencing any problems, there is no need to return our call.  We will assume that you have returned to your regular daily activities without incident.  If any biopsies were taken you will be contacted by phone or by letter within the next 1-3 weeks.  Please call us at [phone] if you have not heard about the biopsies in 3 weeks.    SIGNATURES/CONFIDENTIALITY: You and/or your care partner have signed paperwork which will be entered into your electronic medical record.  These signatures attest to the fact that that the information above on your After Visit Summary has been reviewed and is understood.  Full responsibility of the confidentiality of this discharge information lies with you and/or your care-partner.  Polyp, diverticulosis, hemorrhoid information given.  Hiatal hernia information given.

## 2018-04-22 NOTE — Progress Notes (Signed)
Labs drawn prior to discharge.

## 2018-04-22 NOTE — Op Note (Signed)
[place] Patient Name: Eric Dorsey Procedure Date: 04/22/2018 3:18 PM MRN: [id number] Endoscopist: Jerene Bears , MD Age: 62 Referring MD:  Date of Birth: 1956-02-24 Gender: Male Account #: [id number] Procedure:                Upper GI endoscopy Indications:              Iron deficiency anemia Medicines:                Monitored Anesthesia Care Procedure:                Pre-Anesthesia Assessment:                           - Prior to the procedure, a History and Physical                            was performed, and patient medications and                            allergies were reviewed. The patient's tolerance of                            previous anesthesia was also reviewed. The risks                            and benefits of the procedure and the sedation                            options and risks were discussed with the patient.                            All questions were answered, and informed consent                            was obtained. Prior Anticoagulants: The patient has                            taken no previous anticoagulant or antiplatelet                            agents. ASA Grade Assessment: II - A patient with                            mild systemic disease. After reviewing the risks                            and benefits, the patient was deemed in                            satisfactory condition to undergo the procedure.                           After obtaining informed consent, the endoscope was  passed under direct vision. Throughout the                            procedure, the patient's blood pressure, pulse, and                            oxygen saturations were monitored continuously. The                            Endoscope was introduced through the mouth, and                            advanced to the second part of duodenum. The upper                            GI endoscopy was accomplished without  difficulty.                            The patient tolerated the procedure well. Scope In: Scope Out: Findings:                 Normal mucosa was found in the entire esophagus. 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                           A low-grade of narrowing Schatzki ring was found at                            the gastroesophageal junction. There was mild                            resistance to scope passage at the GE junction and                            then a mucosal rent at the level of the ring.                            Self-limited heme.                           A 3 cm hiatal hernia was present.                           The entire examined stomach was normal. Biopsies  were taken with a cold forceps for Helicobacter                            pylori testing given IDA.                           Evidence of a PEG tube site was found in the                            gastric body.                           Localized mild inflammation characterized by                            erythema was found in the duodenal bulb.                           The second portion of the duodenum was normal.                            Biopsies for histology were taken with a cold                            forceps for evaluation of celiac disease. Complications:            No immediate complications. Estimated Blood Loss:     Estimated blood loss was minimal. Impression:               - Normal mucosa was found in the entire esophagus.                           - Low-grade of narrowing Schatzki ring.                           - 3 cm hiatal hernia.                           - Normal stomach. Biopsied.                           - A former PEG tube site was found.                           - Mild duodenitis.                            - Normal second portion of the duodenum. Biopsied. Recommendation:           - Patient has a contact number available for                            emergencies. The signs and symptoms of potential                            delayed complications were discussed with the  patient. Return to normal activities tomorrow.                            Written discharge instructions were provided to the                            patient.                           - Resume previous diet.                           - Continue present medications.                           - Await pathology results.                           - See the other procedure note for documentation of                            additional recommendations. Jerene Bears, MD 04/22/2018 4:01:15 PM This report has been signed electronically.

## 2018-04-22 NOTE — Progress Notes (Signed)
Called to room to assist during endoscopic procedure.  Patient ID and intended procedure confirmed with present staff. Received instructions for my participation in the procedure from the performing physician.  

## 2018-04-22 NOTE — Op Note (Signed)
[name] Patient Name: Eric Dorsey Procedure Date: 04/22/2018 3:18 PM MRN: [id number] Endoscopist: Jerene Bears , MD Age: 63 Referring MD:  Date of Birth: 22-Jul-1956 Gender: Male Account #: [id number] Procedure:                Colonoscopy Indications:              Iron deficiency anemia Medicines:                Monitored Anesthesia Care Procedure:                Pre-Anesthesia Assessment:                           - Prior to the procedure, a History and Physical                            was performed, and patient medications and                            allergies were reviewed. The patient's tolerance of                            previous anesthesia was also reviewed. The risks                            and benefits of the procedure and the sedation                            options and risks were discussed with the patient.                            All questions were answered, and informed consent                            was obtained. Prior Anticoagulants: The patient has                            taken no previous anticoagulant or antiplatelet                            agents. ASA Grade Assessment: II - A patient with                            mild systemic disease. After reviewing the risks                            and benefits, the patient was deemed in                            satisfactory condition to undergo the procedure.                           After obtaining informed consent, the colonoscope  was passed under direct vision. Throughout the                            procedure, the patient's blood pressure, pulse, and                            oxygen saturations were monitored continuously. The                            Model PCF-[id number]) scope was introduced                            through the anus and advanced to the cecum,                            identified by appendiceal orifice and ileocecal                             valve. The colonoscopy was performed without                            difficulty. The patient tolerated the procedure                            well. The quality of the bowel preparation was                            good. The ileocecal valve, appendiceal orifice, and                            rectum were photographed. Scope In: 3:37:47 PM Scope Out: 3:52:10 PM Scope Withdrawal Time: 0 hours 9 minutes 48 seconds  Total Procedure Duration: 0 hours 14 minutes 23 seconds  Findings:                 The digital rectal exam was normal.                           A single medium-sized angioectasia with typical                            arborization was found in the cecum. APC not                            available so lesion not treated today.                           A 5 mm polyp was found in the cecum. The polyp was                            sessile. The polyp was removed with a cold snare.                            Resection and retrieval were complete.  Three sessile polyps were found in the transverse                            colon. The polyps were 3 to 4 mm in size. These                            polyps were removed with a cold snare. Resection                            and retrieval were complete.                           Multiple small and large-mouthed diverticula were                            found in the sigmoid colon and descending colon.                           Internal hemorrhoids were found during                            retroflexion. The hemorrhoids were moderate. Complications:            No immediate complications. Estimated Blood Loss:     Estimated blood loss was minimal. Impression:               - A single colonic angioectasia. Possible source                            for IDA.                           - One 5 mm polyp in the cecum, removed with a cold                            snare. Resected  and retrieved.                           - Three 3 to 4 mm polyps in the transverse colon,                            removed with a cold snare. Resected and retrieved.                           - Moderate diverticulosis in the sigmoid colon and                            in the descending colon.                           - Internal hemorrhoids. Recommendation:           - Patient has a contact number available for  emergencies. The signs and symptoms of potential                            delayed complications were discussed with the                            patient. Return to normal activities tomorrow.                            Written discharge instructions were provided to the                            patient.                           - Resume previous diet.                           - Continue present medications.                           - Await pathology results.                           - Repeat CBC, ferritin and IBC panel today. May                            need IV iron.                           - Repeat colonoscopy for surveillance based on                            pathology results. Jerene Bears, MD 04/22/2018 4:05:41 PM This report has been signed electronically.

## 2018-04-22 NOTE — Progress Notes (Signed)
Pt's states no medical or surgical changes since previsit or office visit. 

## 2018-04-23 ENCOUNTER — Other Ambulatory Visit: Payer: Self-pay

## 2018-04-23 ENCOUNTER — Telehealth: Payer: Self-pay | Admitting: *Deleted

## 2018-04-23 DIAGNOSIS — D509 Iron deficiency anemia, unspecified: Secondary | ICD-10-CM

## 2018-04-23 NOTE — Telephone Encounter (Signed)
  Follow up Call-  Call back number 04/22/2018  Post procedure Call Back phone  # [phone]  Permission to leave phone message Yes  Some recent data might be hidden     Patient questions:  Do you have a fever, pain , or abdominal swelling? No. Pain Score  0 *  Have you tolerated food without any problems? Yes.    Have you been able to return to your normal activities? Yes.    Do you have any questions about your discharge instructions: Diet   No. Medications  No. Follow up visit  No.  Do you have questions or concerns about your Care? No.  Actions: * If pain score is 4 or above: No action needed, pain <4.

## 2018-04-27 ENCOUNTER — Encounter: Payer: Self-pay | Admitting: Internal Medicine

## 2018-04-30 ENCOUNTER — Ambulatory Visit (HOSPITAL_COMMUNITY)
Admission: RE | Admit: 2018-04-30 | Discharge: 2018-04-30 | Disposition: A | Discharge status: Home / Self Care | Payer: Medicaid Other | Source: Ambulatory Visit | Attending: Internal Medicine | Admitting: Internal Medicine

## 2018-04-30 DIAGNOSIS — D509 Iron deficiency anemia, unspecified: Secondary | ICD-10-CM | POA: Diagnosis not present

## 2018-04-30 MED ORDER — SODIUM CHLORIDE 0.9 % IV SOLN
510.0000 mg | INTRAVENOUS | Status: DC
  Administered 2018-04-30: 510 mg via INTRAVENOUS
  Filled 2018-04-30: qty 17

## 2018-04-30 NOTE — Discharge Instructions (Signed)

## 2018-05-07 ENCOUNTER — Encounter (HOSPITAL_COMMUNITY)
Admission: RE | Admit: 2018-05-07 | Discharge: 2018-05-07 | Disposition: A | Discharge status: Home / Self Care | Payer: Medicaid Other | Source: Ambulatory Visit | Attending: Internal Medicine | Admitting: Internal Medicine

## 2018-05-07 DIAGNOSIS — D509 Iron deficiency anemia, unspecified: Secondary | ICD-10-CM | POA: Insufficient documentation

## 2018-05-07 MED ORDER — SODIUM CHLORIDE 0.9 % IV SOLN
510.0000 mg | INTRAVENOUS | Status: AC
  Administered 2018-05-07: 510 mg via INTRAVENOUS
  Filled 2018-05-07: qty 17

## 2018-06-22 ENCOUNTER — Other Ambulatory Visit: Payer: Self-pay

## 2018-06-22 ENCOUNTER — Encounter (HOSPITAL_COMMUNITY): Payer: Self-pay

## 2018-06-22 ENCOUNTER — Emergency Department (HOSPITAL_COMMUNITY)
Admission: EM | Admit: 2018-06-22 | Discharge: 2018-06-22 | Disposition: A | Discharge status: Home / Self Care | Payer: Medicaid Other | Attending: Emergency Medicine | Admitting: Emergency Medicine

## 2018-06-22 ENCOUNTER — Emergency Department (HOSPITAL_COMMUNITY): Payer: Medicaid Other

## 2018-06-22 DIAGNOSIS — Z79899 Other long term (current) drug therapy: Secondary | ICD-10-CM | POA: Insufficient documentation

## 2018-06-22 DIAGNOSIS — E039 Hypothyroidism, unspecified: Secondary | ICD-10-CM | POA: Insufficient documentation

## 2018-06-22 DIAGNOSIS — R531 Weakness: Secondary | ICD-10-CM

## 2018-06-22 DIAGNOSIS — M6281 Muscle weakness (generalized): Secondary | ICD-10-CM | POA: Insufficient documentation

## 2018-06-22 LAB — COMPREHENSIVE METABOLIC PANEL
ALT: 15 U/L (ref 0–44)
AST: 30 U/L (ref 15–41)
Albumin: 4.3 g/dL (ref 3.5–5.0)
Alkaline Phosphatase: 95 U/L (ref 38–126)
Anion gap: 11 (ref 5–15)
BUN: 8 mg/dL (ref 8–23)
CO2: 27 mmol/L (ref 22–32)
Calcium: 9.5 mg/dL (ref 8.9–10.3)
Chloride: 101 mmol/L (ref 98–111)
Creatinine, Ser: 1.03 mg/dL (ref 0.61–1.24)
GFR calc Af Amer: >60 mL/min (ref >60)
GFR calc non Af Amer: >60 mL/min (ref >60)
Glucose, Bld: 97 mg/dL (ref 70–99)
Potassium: 3.7 mmol/L (ref 3.5–5.1)
Sodium: 139 mmol/L (ref 135–145)
Total Bilirubin: 0.3 mg/dL (ref 0.3–1.2)
Total Protein: 7.7 g/dL (ref 6.5–8.1)

## 2018-06-22 LAB — URINALYSIS, ROUTINE W REFLEX MICROSCOPIC
Bilirubin Urine: NEGATIVE
Glucose, UA: NEGATIVE mg/dL
Hgb urine dipstick: NEGATIVE
Ketones, ur: NEGATIVE mg/dL
Leukocytes, UA: NEGATIVE
Nitrite: NEGATIVE
Protein, ur: NEGATIVE mg/dL
Specific Gravity, Urine: 1.018 (ref 1.005–1.030)
pH: 7 (ref 5.0–8.0)

## 2018-06-22 LAB — TYPE AND SCREEN
ABO/RH(D): O POS
Antibody Screen: POSITIVE

## 2018-06-22 LAB — CBC
HCT: 37.6 % — ABNORMAL LOW (ref 39.0–52.0)
Hemoglobin: 12.4 g/dL — ABNORMAL LOW (ref 13.0–17.0)
MCH: 30.4 pg (ref 26.0–34.0)
MCHC: 33 g/dL (ref 30.0–36.0)
MCV: 92.2 fL (ref 78.0–100.0)
Platelets: 238 10*3/uL (ref 150–400)
RBC: 4.08 MIL/uL — ABNORMAL LOW (ref 4.22–5.81)
RDW: 20.1 % — ABNORMAL HIGH (ref 11.5–15.5)
WBC: 5.4 10*3/uL (ref 4.0–10.5)

## 2018-06-22 LAB — CBG MONITORING, ED: Glucose-Capillary: 97 mg/dL (ref 70–99)

## 2018-06-22 LAB — D-DIMER, QUANTITATIVE: D-Dimer, Quant: 0.36 ug/mL-FEU (ref 0.00–0.50)

## 2018-06-22 LAB — TROPONIN I: Troponin I: <0.03 ng/mL (ref <0.03)

## 2018-06-22 NOTE — ED Notes (Signed)
MADE AWARE EDP KOHUT HAD ORDER ADDITIONAL TEST AND RESULTS WERE PENDING AND PT WAS NOT UP FOR DISCHARGE. WOULD KEEP PT UPDATED. PT APPRECIATED THE UPDATE

## 2018-06-22 NOTE — ED Notes (Signed)
ED Provider at bedside. EDP KOHUT 

## 2018-06-22 NOTE — ED Triage Notes (Signed)
Patient reports that he began feeling weak approx 2 weeks ago, but has gotten worse in the past few days. Patient states at times he has to sit down due to SOB.

## 2018-06-22 NOTE — ED Notes (Signed)
MARTIN NT VERIFIED BLOOD COLLECTION
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
## 2018-06-22 NOTE — ED Notes (Signed)
Patient transported to X-ray 

## 2018-06-22 NOTE — ED Notes (Signed)
EKG COMPLETED IN TRIAGE

## 2018-07-27 ENCOUNTER — Other Ambulatory Visit (INDEPENDENT_AMBULATORY_CARE_PROVIDER_SITE_OTHER): Payer: Medicaid Other

## 2018-07-27 DIAGNOSIS — D509 Iron deficiency anemia, unspecified: Secondary | ICD-10-CM

## 2018-07-27 LAB — CBC WITH DIFFERENTIAL/PLATELET
BASOS ABS: 0.1 10*3/uL (ref 0.0–0.1)
BASOS PCT: 0.9 % (ref 0.0–3.0)
EOS ABS: 0.1 10*3/uL (ref 0.0–0.7)
Eosinophils Relative: 1.8 % (ref 0.0–5.0)
HEMATOCRIT: 32.6 % — AB (ref 39.0–52.0)
Hemoglobin: 11 g/dL — ABNORMAL LOW (ref 13.0–17.0)
LYMPHS ABS: 0.9 10*3/uL (ref 0.7–4.0)
LYMPHS PCT: 15 % (ref 12.0–46.0)
MCHC: 33.9 g/dL (ref 30.0–36.0)
MCV: 92.2 fl (ref 78.0–100.0)
MONO ABS: 0.4 10*3/uL (ref 0.1–1.0)
Monocytes Relative: 6.7 % (ref 3.0–12.0)
NEUTROS ABS: 4.4 10*3/uL (ref 1.4–7.7)
Neutrophils Relative %: 75.6 % (ref 43.0–77.0)
PLATELETS: 364 10*3/uL (ref 150.0–400.0)
RBC: 3.53 Mil/uL — ABNORMAL LOW (ref 4.22–5.81)
RDW: 17.3 % — AB (ref 11.5–15.5)
WBC: 5.8 10*3/uL (ref 4.0–10.5)

## 2018-07-27 LAB — IBC PANEL
Iron: 60 ug/dL (ref 42–165)
SATURATION RATIOS: 14.6 % — AB (ref 20.0–50.0)
TRANSFERRIN: 294 mg/dL (ref 212.0–360.0)

## 2018-07-27 LAB — FERRITIN: Ferritin: 16.8 ng/mL — ABNORMAL LOW (ref 22.0–322.0)

## 2018-08-10 ENCOUNTER — Other Ambulatory Visit: Payer: Self-pay

## 2018-08-10 DIAGNOSIS — D509 Iron deficiency anemia, unspecified: Secondary | ICD-10-CM

## 2018-08-12 ENCOUNTER — Ambulatory Visit (HOSPITAL_COMMUNITY)
Admission: RE | Admit: 2018-08-12 | Discharge: 2018-08-12 | Disposition: A | Discharge status: Home / Self Care | Payer: Medicaid Other | Source: Ambulatory Visit | Attending: Internal Medicine | Admitting: Internal Medicine

## 2018-08-12 DIAGNOSIS — D509 Iron deficiency anemia, unspecified: Secondary | ICD-10-CM | POA: Insufficient documentation

## 2018-08-12 MED ORDER — SODIUM CHLORIDE 0.9 % IV SOLN
INTRAVENOUS | Status: DC | PRN
  Administered 2018-08-12: 250 mL via INTRAVENOUS

## 2018-08-12 MED ORDER — SODIUM CHLORIDE 0.9 % IV SOLN
510.0000 mg | INTRAVENOUS | Status: DC
  Administered 2018-08-12: 510 mg via INTRAVENOUS
  Filled 2018-08-12: qty 17

## 2018-08-12 NOTE — Discharge Instructions (Signed)

## 2018-08-12 NOTE — Progress Notes (Signed)
PATIENT CARE CENTER NOTE  Diagnosis: Iron Deficiency Anemia    Provider: Dr. Zenovia Jarred    Procedure: IV Feraheme    Note: Patient received Feraheme infusion. Monitored patient for 30 minutes post-infusion. Patient tolerated procedure well with no adverse reaction and vital signs remained stable. Discharge instructions given. Patient alert, oriented and ambulatory at discharge.

## 2018-08-19 ENCOUNTER — Ambulatory Visit (HOSPITAL_COMMUNITY)
Admission: RE | Admit: 2018-08-19 | Discharge: 2018-08-19 | Disposition: A | Discharge status: Home / Self Care | Payer: Medicaid Other | Source: Ambulatory Visit | Attending: Internal Medicine | Admitting: Internal Medicine

## 2018-08-19 DIAGNOSIS — D509 Iron deficiency anemia, unspecified: Secondary | ICD-10-CM | POA: Insufficient documentation

## 2018-08-19 MED ORDER — SODIUM CHLORIDE 0.9 % IV SOLN
510.0000 mg | Freq: Once | INTRAVENOUS | Status: AC
  Administered 2018-08-19: 510 mg via INTRAVENOUS
  Filled 2018-08-19: qty 17

## 2018-08-19 MED ORDER — SODIUM CHLORIDE 0.9 % IV SOLN
INTRAVENOUS | Status: DC | PRN
  Administered 2018-08-19: 250 mL via INTRAVENOUS

## 2018-08-19 NOTE — Discharge Instructions (Signed)

## 2018-08-19 NOTE — Progress Notes (Signed)
Patient received Feraheme via PIV. Observed for at least 30 minutes post infusion.Tolerated well, vitals stable, discharge instructions given, verbalized understanding. Patient alert, oriented and ambulatory at the time of discharge.  

## 2018-08-27 ENCOUNTER — Ambulatory Visit (INDEPENDENT_AMBULATORY_CARE_PROVIDER_SITE_OTHER): Payer: Medicaid Other | Admitting: Internal Medicine

## 2018-08-27 ENCOUNTER — Encounter: Payer: Self-pay | Admitting: Internal Medicine

## 2018-08-27 DIAGNOSIS — D509 Iron deficiency anemia, unspecified: Secondary | ICD-10-CM | POA: Diagnosis not present

## 2018-08-27 NOTE — Progress Notes (Signed)
Patient here for capsule endoscopy. Patient upon swallowing the capsule became choked. His airway was obstructed, he tried to clear it and was unsuccessful. I had to perform the Heimlich maneuver. Administered 3 abdominal thrust and capsule finally cleared. Patient never lost consciousness. Upon capsule clearing he then swallowed the capsule without further difficulty. Sat patient down in chair in the room, stayed with patient 15-20 minutes. Patient verbalized that this sometimes happens when he is eating food. Patient was able to speak without difficulty, swallow water without difficulty, he had no abdominal pain or rib pain. He was calm and in no distress upon leaving.  Verbalizes understanding of written and verbal instructions.

## 2018-08-28 ENCOUNTER — Telehealth: Payer: Self-pay

## 2018-08-28 NOTE — Telephone Encounter (Signed)
-----   Message from Jerene Bears, MD sent at 08/27/2018 10:01 AM EDT ----- Thanks for letting me know Can you please call him tomorrow to check on him Thanks JMP  ----- Message ----- From: Wyatt Haste, RN Sent: 08/27/2018   9:32 AM EDT To: Jerene Bears, MD  Wanted to let you know about the difficulty with this capsule patient today. He became choked on capsule, his airway was obstructed and I had to do the Heimlich maneuver on him. After 3 abdominal thrusts his airway cleared and patient was okay. I did chart this experience in the note.He did also let me know, after this happened, that he sometimes experiences this when he is eating. Per Barbera Setters, I am completing a [employer] incident. But just wanted to let you know about it as well. Patient was breathing normally, speaking and able to swallow water without any difficulty upon leaving. No abdominal pain or rib/chest pain. Almyra Free

## 2018-08-28 NOTE — Telephone Encounter (Signed)
Spoke with pt and he reports he is doing well, has not passed capsule yet. Pt instructed to call if he has any problems.

## 2018-09-09 ENCOUNTER — Other Ambulatory Visit: Payer: Self-pay

## 2018-09-09 ENCOUNTER — Telehealth: Payer: Self-pay

## 2018-09-09 DIAGNOSIS — D649 Anemia, unspecified: Secondary | ICD-10-CM

## 2018-09-09 NOTE — Telephone Encounter (Signed)
Pt scheduled for previsit 10/02/18@2pm , colon with apc scheduled at [facility] 10/13/18@11 :15am. Pt aware of appts.

## 2018-09-09 NOTE — Telephone Encounter (Signed)
Then would NOT repeat   Given persistent iron deficiency anemia and known cecal angiectasia my recommendation will be for repeat colonoscopy in the outpatient hospital setting for APC of the cecal angiectasia. This lesion can cause chronic blood loss and IDA   JMP   Left message for pt to call back.

## 2018-10-02 ENCOUNTER — Ambulatory Visit (AMBULATORY_SURGERY_CENTER): Payer: Self-pay

## 2018-10-02 VITALS — Ht 67.0 in | Wt 139.4 lb

## 2018-10-02 DIAGNOSIS — D509 Iron deficiency anemia, unspecified: Secondary | ICD-10-CM

## 2018-10-02 MED ORDER — NA SULFATE-K SULFATE-MG SULF 17.5-3.13-1.6 GM/177ML PO SOLN: 1.0000 | Freq: Once | ORAL | 0 refills | Status: AC

## 2018-10-02 NOTE — Progress Notes (Signed)
No egg or soy allergy known to patient  No issues with past sedation with any surgeries  or procedures, no intubation problems  No diet pills per patient No home 02 use per patient  No blood thinners per patient  Pt denies issues with constipation  No A fib or A flutter  EMMI video sent to pt's e mail  Pt. declined 

## 2018-10-09 ENCOUNTER — Other Ambulatory Visit: Payer: Self-pay

## 2018-10-09 ENCOUNTER — Encounter (HOSPITAL_COMMUNITY): Payer: Self-pay | Admitting: *Deleted

## 2018-10-13 ENCOUNTER — Encounter (HOSPITAL_COMMUNITY)
Admission: RE | Disposition: A | Discharge status: Home / Self Care | Payer: Self-pay | Source: Ambulatory Visit | Attending: Internal Medicine

## 2018-10-13 ENCOUNTER — Ambulatory Visit (HOSPITAL_COMMUNITY): Payer: Medicaid Other | Admitting: Anesthesiology

## 2018-10-13 ENCOUNTER — Encounter (HOSPITAL_COMMUNITY): Payer: Self-pay | Admitting: *Deleted

## 2018-10-13 ENCOUNTER — Other Ambulatory Visit: Payer: Self-pay

## 2018-10-13 ENCOUNTER — Ambulatory Visit (HOSPITAL_COMMUNITY)
Admission: RE | Admit: 2018-10-13 | Discharge: 2018-10-13 | Disposition: A | Discharge status: Home / Self Care | Payer: Medicaid Other | Source: Ambulatory Visit | Attending: Internal Medicine | Admitting: Internal Medicine

## 2018-10-13 DIAGNOSIS — D122 Benign neoplasm of ascending colon: Secondary | ICD-10-CM

## 2018-10-13 DIAGNOSIS — K648 Other hemorrhoids: Secondary | ICD-10-CM | POA: Diagnosis not present

## 2018-10-13 DIAGNOSIS — D500 Iron deficiency anemia secondary to blood loss (chronic): Secondary | ICD-10-CM | POA: Diagnosis not present

## 2018-10-13 DIAGNOSIS — K5520 Angiodysplasia of colon without hemorrhage: Secondary | ICD-10-CM

## 2018-10-13 DIAGNOSIS — D649 Anemia, unspecified: Secondary | ICD-10-CM

## 2018-10-13 DIAGNOSIS — Z8521 Personal history of malignant neoplasm of larynx: Secondary | ICD-10-CM | POA: Diagnosis not present

## 2018-10-13 DIAGNOSIS — K5730 Diverticulosis of large intestine without perforation or abscess without bleeding: Secondary | ICD-10-CM

## 2018-10-13 DIAGNOSIS — K219 Gastro-esophageal reflux disease without esophagitis: Secondary | ICD-10-CM | POA: Diagnosis not present

## 2018-10-13 DIAGNOSIS — K2950 Unspecified chronic gastritis without bleeding: Secondary | ICD-10-CM | POA: Diagnosis not present

## 2018-10-13 DIAGNOSIS — Z87891 Personal history of nicotine dependence: Secondary | ICD-10-CM | POA: Diagnosis not present

## 2018-10-13 DIAGNOSIS — Z79899 Other long term (current) drug therapy: Secondary | ICD-10-CM | POA: Diagnosis not present

## 2018-10-13 DIAGNOSIS — J45909 Unspecified asthma, uncomplicated: Secondary | ICD-10-CM | POA: Diagnosis not present

## 2018-10-13 HISTORY — PX: SUBMUCOSAL INJECTION: SHX5543

## 2018-10-13 HISTORY — PX: POLYPECTOMY: SHX5525

## 2018-10-13 HISTORY — PX: COLONOSCOPY WITH PROPOFOL: SHX5780

## 2018-10-13 HISTORY — PX: HOT HEMOSTASIS: SHX5433

## 2018-10-13 SURGERY — COLONOSCOPY WITH PROPOFOL
Anesthesia: Monitor Anesthesia Care

## 2018-10-13 MED ORDER — LACTATED RINGERS IV SOLN
INTRAVENOUS | Status: DC
  Administered 2018-10-13: 10:00:00 via INTRAVENOUS

## 2018-10-13 MED ORDER — SODIUM CHLORIDE 0.9 % IV SOLN: INTRAVENOUS | Status: DC

## 2018-10-13 MED ORDER — SODIUM CHLORIDE 0.9 % IJ SOLN
PREFILLED_SYRINGE | INTRAMUSCULAR | Status: DC | PRN
  Administered 2018-10-13: 2 mL

## 2018-10-13 MED ORDER — PROPOFOL 10 MG/ML IV BOLUS
INTRAVENOUS | Status: AC
  Filled 2018-10-13: qty 40

## 2018-10-13 MED ORDER — PROPOFOL 500 MG/50ML IV EMUL
INTRAVENOUS | Status: DC | PRN
  Administered 2018-10-13: 150 ug/kg/min via INTRAVENOUS

## 2018-10-13 MED ORDER — PROPOFOL 500 MG/50ML IV EMUL
INTRAVENOUS | Status: DC | PRN
  Administered 2018-10-13: 50 mg via INTRAVENOUS
  Administered 2018-10-13: 40 mg via INTRAVENOUS

## 2018-10-13 MED ORDER — EPINEPHRINE PF 1 MG/10ML IJ SOSY
PREFILLED_SYRINGE | INTRAMUSCULAR | Status: AC
  Filled 2018-10-13: qty 10

## 2018-10-13 SURGICAL SUPPLY — 22 items

## 2018-10-13 NOTE — Transfer of Care (Signed)
Immediate Anesthesia Transfer of Care Note  Patient: BASSEM BERNASCONI  Procedure(s) Performed: Procedure(s): COLONOSCOPY WITH PROPOFOL (N/A) HOT HEMOSTASIS (ARGON PLASMA COAGULATION/BICAP) (N/A) SUBMUCOSAL INJECTION POLYPECTOMY  Patient Location: PACU  Anesthesia Type:MAC  Level of Consciousness:  sedated, patient cooperative and responds to stimulation  Airway & Oxygen Therapy:Patient Spontanous Breathing and Patient connected to face mask oxgen  Post-op Assessment:  Report given to PACU RN and Post -op Vital signs reviewed and stable  Post vital signs:  Reviewed and stable  Last Vitals:  Vitals:   10/13/18 1007  BP: (!) 149/80  Pulse: 79  Resp: 16  Temp: 36.6 C  SpO2: 161%    Complications: No apparent anesthesia complications

## 2018-10-13 NOTE — Anesthesia Postprocedure Evaluation (Signed)
Anesthesia Post Note  Patient: Eric Dorsey  Procedure(s) Performed: COLONOSCOPY WITH PROPOFOL (N/A ) HOT HEMOSTASIS (ARGON PLASMA COAGULATION/BICAP) (N/A ) SUBMUCOSAL INJECTION POLYPECTOMY     Patient location during evaluation: Endoscopy Anesthesia Type: MAC Level of consciousness: awake and alert Pain management: pain level controlled Vital Signs Assessment: post-procedure vital signs reviewed and stable Respiratory status: spontaneous breathing, nonlabored ventilation and respiratory function stable Cardiovascular status: stable and blood pressure returned to baseline Postop Assessment: no apparent nausea or vomiting Anesthetic complications: no    Last Vitals:  Vitals:   10/13/18 1110 10/13/18 1120  BP: (!) 154/101 (!) 166/96  Pulse: 75 77  Resp: 18 14  Temp:    SpO2: 100% 100%    Last Pain:  Vitals:   10/13/18 1120  TempSrc:   PainSc: 0-No pain                 Lynda Rainwater

## 2018-10-13 NOTE — Anesthesia Preprocedure Evaluation (Signed)
Anesthesia Evaluation  Patient identified by MRN, date of birth, ID band Patient awake    Reviewed: Allergy & Precautions, NPO status , Patient's Chart, lab work & pertinent test results  Airway Mallampati: II  TM Distance: >3 FB Neck ROM: Full    Dental   Pulmonary neg pulmonary ROS, asthma , Recent URI , Current Smoker, former smoker,    breath sounds clear to auscultation       Cardiovascular negative cardio ROS   Rhythm:Regular Rate:Normal     Neuro/Psych negative neurological ROS  negative psych ROS   GI/Hepatic negative GI ROS, Neg liver ROS, GERD  ,History noted. CE   Endo/Other  negative endocrine ROSHypothyroidism   Renal/GU negative Renal ROS  negative genitourinary   Musculoskeletal negative musculoskeletal ROS (+)   Abdominal   Peds negative pediatric ROS (+)  Hematology negative hematology ROS (+)   Anesthesia Other Findings   Reproductive/Obstetrics negative OB ROS                             Anesthesia Physical  Anesthesia Plan  ASA: III  Anesthesia Plan: MAC   Post-op Pain Management:    Induction: Intravenous  PONV Risk Score and Plan: 1 and Ondansetron and Treatment may vary due to age or medical condition  Airway Management Planned: Nasal Cannula  Additional Equipment:   Intra-op Plan:   Post-operative Plan:   Informed Consent: I have reviewed the patients History and Physical, chart, labs and discussed the procedure including the risks, benefits and alternatives for the proposed anesthesia with the patient or authorized representative who has indicated his/her understanding and acceptance.   Dental advisory given  Plan Discussed with:   Anesthesia Plan Comments:         Anesthesia Quick Evaluation

## 2018-10-13 NOTE — Op Note (Signed)
[facility] Patient Name: Eric Dorsey Procedure Date: 10/13/2018 MRN: [id number] Attending MD: Jerene Bears , MD Date of Birth: 08-30-1956 CSN: [id number] Age: 62 Admit Type: Outpatient Procedure:                Colonoscopy Indications:              Iron deficiency anemia secondary to chronic blood                            loss, For therapy of angiodysplasia (of large                            intestine) seen at colonoscopy in May 2019 Providers:                Lajuan Lines. Hilarie Fredrickson, MD, Cleda Daub, RN, William Dalton, Technician Referring MD:              Medicines:                Monitored Anesthesia Care Complications:            No immediate complications. Estimated Blood Loss:     Estimated blood loss was minimal. Procedure:                Pre-Anesthesia Assessment:                           - Prior to the procedure, a History and Physical                            was performed, and patient medications and                            allergies were reviewed. The patient's tolerance of                            previous anesthesia was also reviewed. The risks                            and benefits of the procedure and the sedation                            options and risks were discussed with the patient.                            All questions were answered, and informed consent                            was obtained. Prior Anticoagulants: The patient has                            taken no previous anticoagulant or antiplatelet  agents. ASA Grade Assessment: II - A patient with                            mild systemic disease. After reviewing the risks                            and benefits, the patient was deemed in                            satisfactory condition to undergo the procedure.                           After obtaining informed consent, the colonoscope                            was  passed under direct vision. Throughout the                            procedure, the patient's blood pressure, pulse, and                            oxygen saturations were monitored continuously. The                            PCF-H190DL ([id number] Olympus peds colonoscope was                            introduced through the anus and advanced to the                            cecum, identified by appendiceal orifice and                            ileocecal valve. The colonoscopy was performed                            without difficulty. The patient tolerated the                            procedure well. The quality of the bowel                            preparation was good. The ileocecal valve,                            appendiceal orifice, and rectum were photographed. Scope In: 10:35:07 AM Scope Out: 11:02:02 AM Scope Withdrawal Time: 0 hours 21 minutes 53 seconds  Total Procedure Duration: 0 hours 26 minutes 55 seconds  Findings:      Three, separate, small to medium-sized angiodysplastic lesions with       typical arborization were found in the cecum. Area under the 2 larger       lesions was successfully injected with 1 mL of a 1:10,000 solution of       epinephrine for improved access and  to aid in hemostasis (by lifting the       lesion prior to destruction). Fulguration to ablate the lesion by argon       plasma at 0.5 liters/minute and 20 watts was successful.      A single medium-sized angiodysplastic lesion with typical arborization       was found in the ascending colon. Area was successfully injected with 1       mL of a 1:10,000 solution of epinephrine for improved access and aid in       hemostasis (by lifting the lesion prior to destruction). Fulguration to       ablate the lesion by argon plasma at 0.5 liters/minute and 20 watts was       successful.      A 5 mm polyp was found in the ascending colon. The polyp was sessile.       The polyp was removed with a cold  snare. Resection and retrieval were       complete.      Multiple small and large-mouthed diverticula were found from transverse       colon to sigmoid colon.      Internal hemorrhoids were found during retroflexion. The hemorrhoids       were small. Impression:               - Three cecal angiodysplastic lesions                            (angioectasias). Injected. Treated with argon                            plasma coagulation (APC).                           - A single ascending colonic angiodysplastic lesion                            (angioectasia). Injected. Treated with argon plasma                            coagulation (APC).                           - One 5 mm polyp in the ascending colon, removed                            with a cold snare. Resected and retrieved.                           - Diverticulosis from transverse colon to sigmoid                            colon.                           - Internal hemorrhoids. Moderate Sedation:      N/A Recommendation:           - Patient has a contact number available for  emergencies. The signs and symptoms of potential                            delayed complications were discussed with the                            patient. Return to normal activities tomorrow.                            Written discharge instructions were provided to the                            patient.                           - Resume previous diet.                           - Continue present medications.                           - Avoid aspirin, ibuprofen, naproxen, or other                            non-steroidal anti-inflammatory drugs.                           - Await pathology results.                           - Repeat colonoscopy at previously recommended                            interval.                           - Check CBC, ferritin and IBC panel in November                            2019 to ensure  improvement. Replace iron IV as                            needed. Procedure Code(s):        --- Professional ---                           [phone], Colonoscopy, flexible; with ablation of                            tumor(s), polyp(s), or other lesion(s) (includes                            pre- and post-dilation and guide wire passage, when                            performed)  32951, 59, Colonoscopy, flexible; with removal of                            tumor(s), polyp(s), or other lesion(s) by snare                            technique                           45381, Colonoscopy, flexible; with directed                            submucosal injection(s), any substance Diagnosis Code(s):        --- Professional ---                           K55.20, Angiodysplasia of colon without hemorrhage                           D12.2, Benign neoplasm of ascending colon                           K64.8, Other hemorrhoids                           D50.0, Iron deficiency anemia secondary to blood                            loss (chronic)                           K57.30, Diverticulosis of large intestine without                            perforation or abscess without bleeding CPT copyright 2018 [employer]. All rights reserved. The codes documented in this report are preliminary and upon coder review may  be revised to meet current compliance requirements. Jerene Bears, MD 10/13/2018 11:15:46 AM This report has been signed electronically. Number of Addenda: 0
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
## 2018-10-13 NOTE — Discharge Instructions (Signed)

## 2018-10-15 ENCOUNTER — Encounter (HOSPITAL_COMMUNITY): Payer: Self-pay | Admitting: Internal Medicine

## 2018-10-15 ENCOUNTER — Encounter: Payer: Self-pay | Admitting: Internal Medicine

## 2018-10-27 ENCOUNTER — Other Ambulatory Visit: Payer: Self-pay

## 2018-10-27 ENCOUNTER — Other Ambulatory Visit (INDEPENDENT_AMBULATORY_CARE_PROVIDER_SITE_OTHER): Payer: Medicaid Other

## 2018-10-27 DIAGNOSIS — D509 Iron deficiency anemia, unspecified: Secondary | ICD-10-CM

## 2018-10-27 LAB — IBC PANEL
Iron: 21 ug/dL — ABNORMAL LOW (ref 42–165)
SATURATION RATIOS: 5.7 % — AB (ref 20.0–50.0)
TRANSFERRIN: 261 mg/dL (ref 212.0–360.0)

## 2018-10-27 LAB — CBC
HCT: 33.7 % — ABNORMAL LOW (ref 39.0–52.0)
Hemoglobin: 11.2 g/dL — ABNORMAL LOW (ref 13.0–17.0)
MCHC: 33.3 g/dL (ref 30.0–36.0)
MCV: 93.2 fl (ref 78.0–100.0)
Platelets: 281 10*3/uL (ref 150.0–400.0)
RBC: 3.61 Mil/uL — AB (ref 4.22–5.81)
RDW: 20 % — ABNORMAL HIGH (ref 11.5–15.5)
WBC: 6 10*3/uL (ref 4.0–10.5)

## 2018-10-27 LAB — FERRITIN: Ferritin: 20.8 ng/mL — ABNORMAL LOW (ref 22.0–322.0)

## 2018-10-28 ENCOUNTER — Other Ambulatory Visit: Payer: Self-pay

## 2018-10-28 DIAGNOSIS — D509 Iron deficiency anemia, unspecified: Secondary | ICD-10-CM

## 2018-10-30 ENCOUNTER — Ambulatory Visit (HOSPITAL_COMMUNITY)
Admission: RE | Admit: 2018-10-30 | Discharge: 2018-10-30 | Disposition: A | Discharge status: Home / Self Care | Payer: Medicaid Other | Source: Ambulatory Visit | Attending: Internal Medicine | Admitting: Internal Medicine

## 2018-10-30 DIAGNOSIS — D509 Iron deficiency anemia, unspecified: Secondary | ICD-10-CM | POA: Insufficient documentation

## 2018-10-30 MED ORDER — SODIUM CHLORIDE 0.9 % IV SOLN
INTRAVENOUS | Status: DC | PRN
  Administered 2018-10-30: 250 mL via INTRAVENOUS

## 2018-10-30 MED ORDER — SODIUM CHLORIDE 0.9 % IV SOLN
510.0000 mg | INTRAVENOUS | Status: DC
  Administered 2018-10-30: 510 mg via INTRAVENOUS
  Filled 2018-10-30: qty 17

## 2018-10-30 NOTE — Discharge Instructions (Signed)

## 2018-10-30 NOTE — Progress Notes (Signed)
PATIENT CARE CENTER NOTE  Diagnosis: Iron Deficiency Anemia    Provider: Dr. Zenovia Jarred   Procedure: Feraheme IV    Note: Patient received IV Feraheme infusion. Monitored patient for 30 minutes post-infusion. No adverse reaction noted. Vital signs stable. Discharge instructions given. Patient alert, oriented and ambulatory at discharge.

## 2018-11-06 ENCOUNTER — Ambulatory Visit (HOSPITAL_COMMUNITY)
Admission: RE | Admit: 2018-11-06 | Discharge: 2018-11-06 | Disposition: A | Discharge status: Home / Self Care | Payer: Medicaid Other | Source: Ambulatory Visit | Attending: Internal Medicine | Admitting: Internal Medicine

## 2018-11-06 DIAGNOSIS — D509 Iron deficiency anemia, unspecified: Secondary | ICD-10-CM

## 2018-11-06 MED ORDER — SODIUM CHLORIDE 0.9 % IV SOLN
510.0000 mg | INTRAVENOUS | Status: DC
  Administered 2018-11-06: 510 mg via INTRAVENOUS
  Filled 2018-11-06: qty 17

## 2018-11-06 MED ORDER — SODIUM CHLORIDE 0.9 % IV SOLN
INTRAVENOUS | Status: DC | PRN
  Administered 2018-11-06: 250 mL via INTRAVENOUS

## 2018-11-06 NOTE — Progress Notes (Signed)
PATIENT CARE CENTER NOTE  Diagnosis: Iron Deficiency Anemia    Provider: Dr. Zenovia Jarred   Procedure: Feraheme IV    Note: Patient received IV Feraheme infusion. Monitored patient for 30 minutes post-infusion. No adverse reaction noted. Vital signs stable. Discharge instructions given. Patient alert, oriented and ambulatory at discharge.

## 2018-11-06 NOTE — Discharge Instructions (Signed)

## 2019-02-08 ENCOUNTER — Emergency Department (HOSPITAL_COMMUNITY)
Admission: EM | Admit: 2019-02-08 | Discharge: 2019-02-08 | Disposition: A | Discharge status: Home / Self Care | Payer: Medicaid Other | Attending: Emergency Medicine | Admitting: Emergency Medicine

## 2019-02-08 ENCOUNTER — Emergency Department (HOSPITAL_COMMUNITY): Payer: Medicaid Other

## 2019-02-08 ENCOUNTER — Encounter (HOSPITAL_COMMUNITY): Payer: Self-pay | Admitting: Emergency Medicine

## 2019-02-08 ENCOUNTER — Other Ambulatory Visit: Payer: Self-pay

## 2019-02-08 ENCOUNTER — Other Ambulatory Visit (INDEPENDENT_AMBULATORY_CARE_PROVIDER_SITE_OTHER): Payer: Medicaid Other

## 2019-02-08 DIAGNOSIS — I77810 Thoracic aortic ectasia: Secondary | ICD-10-CM

## 2019-02-08 DIAGNOSIS — K140 Glossitis: Secondary | ICD-10-CM | POA: Diagnosis not present

## 2019-02-08 DIAGNOSIS — E039 Hypothyroidism, unspecified: Secondary | ICD-10-CM | POA: Diagnosis not present

## 2019-02-08 DIAGNOSIS — R042 Hemoptysis: Secondary | ICD-10-CM | POA: Insufficient documentation

## 2019-02-08 DIAGNOSIS — Z87891 Personal history of nicotine dependence: Secondary | ICD-10-CM | POA: Diagnosis not present

## 2019-02-08 DIAGNOSIS — Z8521 Personal history of malignant neoplasm of larynx: Secondary | ICD-10-CM | POA: Diagnosis not present

## 2019-02-08 DIAGNOSIS — Z79899 Other long term (current) drug therapy: Secondary | ICD-10-CM | POA: Diagnosis not present

## 2019-02-08 DIAGNOSIS — D509 Iron deficiency anemia, unspecified: Secondary | ICD-10-CM

## 2019-02-08 DIAGNOSIS — Z923 Personal history of irradiation: Secondary | ICD-10-CM | POA: Diagnosis not present

## 2019-02-08 DIAGNOSIS — R05 Cough: Secondary | ICD-10-CM | POA: Diagnosis present

## 2019-02-08 DIAGNOSIS — R070 Pain in throat: Secondary | ICD-10-CM | POA: Insufficient documentation

## 2019-02-08 LAB — IBC PANEL
IRON: 40 ug/dL — AB (ref 42–165)
Saturation Ratios: 11.3 % — ABNORMAL LOW (ref 20.0–50.0)
Transferrin: 253 mg/dL (ref 212.0–360.0)

## 2019-02-08 LAB — CBC WITH DIFFERENTIAL/PLATELET
ABS IMMATURE GRANULOCYTES: 0.02 10*3/uL (ref 0.00–0.07)
BASOS PCT: 0.9 % (ref 0.0–3.0)
Basophils Absolute: 0 10*3/uL (ref 0.0–0.1)
Basophils Absolute: 0.1 10*3/uL (ref 0.0–0.1)
Basophils Relative: 1 %
EOS PCT: 1.9 % (ref 0.0–5.0)
Eosinophils Absolute: 0.1 10*3/uL (ref 0.0–0.5)
Eosinophils Absolute: 0.1 10*3/uL (ref 0.0–0.7)
Eosinophils Relative: 1 %
HCT: 35.8 % — ABNORMAL LOW (ref 39.0–52.0)
HEMATOCRIT: 37.8 % — AB (ref 39.0–52.0)
HEMOGLOBIN: 11.8 g/dL — AB (ref 13.0–17.0)
Hemoglobin: 12.2 g/dL — ABNORMAL LOW (ref 13.0–17.0)
Immature Granulocytes: 0 %
LYMPHS ABS: 0.8 10*3/uL (ref 0.7–4.0)
LYMPHS PCT: 11 %
Lymphocytes Relative: 11.5 % — ABNORMAL LOW (ref 12.0–46.0)
Lymphs Abs: 0.9 10*3/uL (ref 0.7–4.0)
MCH: 31.3 pg (ref 26.0–34.0)
MCHC: 31.2 g/dL (ref 30.0–36.0)
MCHC: 34.2 g/dL (ref 30.0–36.0)
MCV: 100.3 fL — AB (ref 80.0–100.0)
MCV: 94.9 fl (ref 78.0–100.0)
MONO ABS: 0.5 10*3/uL (ref 0.1–1.0)
MONO ABS: 0.6 10*3/uL (ref 0.1–1.0)
MONOS PCT: 7 %
Monocytes Relative: 7.6 % (ref 3.0–12.0)
NEUTROS ABS: 5.5 10*3/uL (ref 1.7–7.7)
Neutro Abs: 5.8 10*3/uL (ref 1.4–7.7)
Neutrophils Relative %: 80 %
Neutrophils Relative %: 78.1 % — ABNORMAL HIGH (ref 43.0–77.0)
Platelets: 385 10*3/uL (ref 150–400)
Platelets: 406 10*3/uL — ABNORMAL HIGH (ref 150.0–400.0)
RBC: 3.77 MIL/uL — ABNORMAL LOW (ref 4.22–5.81)
RBC: 3.77 Mil/uL — ABNORMAL LOW (ref 4.22–5.81)
RDW: 15.2 % (ref 11.5–15.5)
RDW: 15.9 % — ABNORMAL HIGH (ref 11.5–15.5)
WBC: 6.9 10*3/uL (ref 4.0–10.5)
WBC: 7.4 10*3/uL (ref 4.0–10.5)
nRBC: 0 % (ref 0.0–0.2)

## 2019-02-08 LAB — BASIC METABOLIC PANEL
Anion gap: 7 (ref 5–15)
BUN: 7 mg/dL — AB (ref 8–23)
CALCIUM: 9.3 mg/dL (ref 8.9–10.3)
CHLORIDE: 105 mmol/L (ref 98–111)
CO2: 27 mmol/L (ref 22–32)
Creatinine, Ser: 1.04 mg/dL (ref 0.61–1.24)
GFR calc Af Amer: >60 mL/min (ref >60)
GLUCOSE: 107 mg/dL — AB (ref 70–99)
POTASSIUM: 4 mmol/L (ref 3.5–5.1)
Sodium: 139 mmol/L (ref 135–145)

## 2019-02-08 LAB — D-DIMER, QUANTITATIVE: D-Dimer, Quant: 0.48 ug/mL-FEU (ref 0.00–0.50)

## 2019-02-08 LAB — FERRITIN: Ferritin: 25.2 ng/mL (ref 22.0–322.0)

## 2019-02-08 MED ORDER — IOPAMIDOL (ISOVUE-370) INJECTION 76%
100.0000 mL | Freq: Once | INTRAVENOUS | Status: AC | PRN
  Administered 2019-02-08: 100 mL via INTRAVENOUS

## 2019-02-08 MED ORDER — IOPAMIDOL (ISOVUE-370) INJECTION 76%
INTRAVENOUS | Status: AC
  Filled 2019-02-08: qty 100

## 2019-02-08 MED ORDER — SODIUM CHLORIDE (PF) 0.9 % IJ SOLN
INTRAMUSCULAR | Status: AC
  Filled 2019-02-08: qty 50

## 2019-02-08 NOTE — Discharge Instructions (Signed)
Please call the ear nose and throat doctor to schedule an appointment for follow-up in regards to the ulceration found on your tongue today on CT imaging.  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  Please return to the emergency department for any new or worsening symptoms in the meantime.

## 2019-02-08 NOTE — ED Triage Notes (Signed)
Pt reports that he had cough for about month and when coughs hard he will have blood in sputum. For about week had sore throat. Reports hx throat cancer.
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
## 2019-02-16 ENCOUNTER — Other Ambulatory Visit: Payer: Self-pay

## 2019-02-16 DIAGNOSIS — D509 Iron deficiency anemia, unspecified: Secondary | ICD-10-CM

## 2019-02-17 ENCOUNTER — Other Ambulatory Visit: Payer: Self-pay

## 2019-02-17 DIAGNOSIS — D509 Iron deficiency anemia, unspecified: Secondary | ICD-10-CM

## 2019-02-19 ENCOUNTER — Ambulatory Visit (HOSPITAL_COMMUNITY)
Admission: RE | Admit: 2019-02-19 | Discharge: 2019-02-19 | Disposition: A | Discharge status: Home / Self Care | Payer: Medicaid Other | Source: Ambulatory Visit | Attending: Internal Medicine | Admitting: Internal Medicine

## 2019-02-19 DIAGNOSIS — D509 Iron deficiency anemia, unspecified: Secondary | ICD-10-CM

## 2019-02-19 MED ORDER — SODIUM CHLORIDE 0.9 % IV SOLN
510.0000 mg | Freq: Once | INTRAVENOUS | Status: AC
  Administered 2019-02-19: 510 mg via INTRAVENOUS
  Filled 2019-02-19: qty 17

## 2019-02-19 MED ORDER — SODIUM CHLORIDE 0.9 % IV SOLN
INTRAVENOUS | Status: DC | PRN
  Administered 2019-02-19: 250 mL via INTRAVENOUS

## 2019-02-19 NOTE — Discharge Instructions (Signed)

## 2019-02-19 NOTE — Progress Notes (Signed)
PATIENT CARE CENTER NOTE  Diagnosis: Iron Deficiency Anemia    Provider: Zenovia Jarred, MD   Procedure: IV Feraheme    Note: Patient received Feraheme infusion. Tolerated well with no adverse reaction. Vital signs stable. Observed patient for 30 minutes-post infusion. Discharge instructions given. Patient alert, oriented and ambulatory at discharge.

## 2019-02-22 ENCOUNTER — Other Ambulatory Visit (HOSPITAL_COMMUNITY): Payer: Self-pay | Admitting: Otolaryngology

## 2019-02-22 ENCOUNTER — Other Ambulatory Visit: Payer: Self-pay | Admitting: Otolaryngology

## 2019-02-22 DIAGNOSIS — C01 Malignant neoplasm of base of tongue: Secondary | ICD-10-CM

## 2019-02-26 ENCOUNTER — Other Ambulatory Visit: Payer: Self-pay

## 2019-02-26 ENCOUNTER — Ambulatory Visit (HOSPITAL_COMMUNITY)
Admission: RE | Admit: 2019-02-26 | Discharge: 2019-02-26 | Disposition: A | Discharge status: Home / Self Care | Payer: Medicaid Other | Source: Ambulatory Visit | Attending: Otolaryngology | Admitting: Otolaryngology

## 2019-02-26 DIAGNOSIS — C01 Malignant neoplasm of base of tongue: Secondary | ICD-10-CM

## 2019-02-26 LAB — GLUCOSE, CAPILLARY: Glucose-Capillary: 94 mg/dL (ref 70–99)

## 2019-02-26 MED ORDER — FLUDEOXYGLUCOSE F - 18 (FDG) INJECTION
7.5000 | Freq: Once | INTRAVENOUS | Status: AC | PRN
  Administered 2019-02-26: 7.5 via INTRAVENOUS

## 2019-02-26 MED ORDER — IOHEXOL 300 MG/ML  SOLN
75.0000 mL | Freq: Once | INTRAMUSCULAR | Status: AC | PRN
  Administered 2019-02-26: 75 mL via INTRAVENOUS

## 2019-02-26 MED ORDER — SODIUM CHLORIDE (PF) 0.9 % IJ SOLN
INTRAMUSCULAR | Status: AC
  Filled 2019-02-26: qty 50

## 2019-03-01 ENCOUNTER — Other Ambulatory Visit: Payer: Self-pay | Admitting: Radiation Oncology

## 2019-03-08 ENCOUNTER — Telehealth: Payer: Self-pay | Admitting: *Deleted

## 2019-03-08 NOTE — Telephone Encounter (Signed)
Oncology Nurse Navigator Documentation  Spoke with Apolonio Schneiders [facility] Otolaryngology Physician Referral.  She confirmed pt has appt with Dr. Donald Pore 03/10/2019 1:30 to discuss TORS per referral from ENT Dr. Erik Obey.  Gayleen Orem, RN, BSN Head & Neck Oncology Nurse [place] at [place] [phone]

## 2019-03-18 ENCOUNTER — Telehealth: Payer: Self-pay | Admitting: *Deleted

## 2019-03-18 NOTE — Telephone Encounter (Signed)
Medical records faxed to [facility] Otolaryngology; [place] [phone]

## 2019-03-23 ENCOUNTER — Telehealth: Payer: Self-pay | Admitting: *Deleted

## 2019-03-23 NOTE — Telephone Encounter (Signed)
On 03-23-19 fax medical records to [facility] , it was consult note ,end of tx note, sim and planning note, follow up note.

## 2019-03-30 ENCOUNTER — Institutional Professional Consult (permissible substitution): Payer: Medicaid Other | Admitting: Thoracic Surgery (Cardiothoracic Vascular Surgery)

## 2019-03-30 ENCOUNTER — Encounter: Payer: Self-pay | Admitting: Thoracic Surgery (Cardiothoracic Vascular Surgery)

## 2019-03-30 ENCOUNTER — Other Ambulatory Visit: Payer: Self-pay

## 2019-03-30 VITALS — BP 143/91 | HR 99 | Temp 97.1°F | Resp 16 | Ht 67.0 in | Wt 142.0 lb

## 2019-03-30 DIAGNOSIS — I712 Thoracic aortic aneurysm, without rupture, unspecified: Secondary | ICD-10-CM

## 2019-03-30 MED ORDER — METOPROLOL SUCCINATE ER 25 MG PO TB24: 25.0000 mg | ORAL_TABLET | Freq: Every day | ORAL | 6 refills | Status: DC
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
## 2019-06-01 ENCOUNTER — Other Ambulatory Visit: Payer: Self-pay | Admitting: *Deleted

## 2019-06-01 ENCOUNTER — Other Ambulatory Visit (INDEPENDENT_AMBULATORY_CARE_PROVIDER_SITE_OTHER): Payer: Medicaid Other

## 2019-06-01 DIAGNOSIS — D509 Iron deficiency anemia, unspecified: Secondary | ICD-10-CM

## 2019-06-01 LAB — CBC WITH DIFFERENTIAL/PLATELET
Basophils Absolute: 0.1 10*3/uL (ref 0.0–0.1)
Basophils Relative: 1 % (ref 0.0–3.0)
Eosinophils Absolute: 0.3 10*3/uL (ref 0.0–0.7)
Eosinophils Relative: 5.1 % — ABNORMAL HIGH (ref 0.0–5.0)
HCT: 39.5 % (ref 39.0–52.0)
Hemoglobin: 13 g/dL (ref 13.0–17.0)
Lymphocytes Relative: 13.6 % (ref 12.0–46.0)
Lymphs Abs: 0.8 10*3/uL (ref 0.7–4.0)
MCHC: 33 g/dL (ref 30.0–36.0)
MCV: 92.6 fl (ref 78.0–100.0)
Monocytes Absolute: 0.4 10*3/uL (ref 0.1–1.0)
Monocytes Relative: 7.1 % (ref 3.0–12.0)
Neutro Abs: 4.5 10*3/uL (ref 1.4–7.7)
Neutrophils Relative %: 73.2 % (ref 43.0–77.0)
Platelets: 287 10*3/uL (ref 150.0–400.0)
RBC: 4.26 Mil/uL (ref 4.22–5.81)
RDW: 16.3 % — ABNORMAL HIGH (ref 11.5–15.5)
WBC: 6.1 10*3/uL (ref 4.0–10.5)

## 2019-06-01 LAB — IBC + FERRITIN
Ferritin: 49 ng/mL (ref 22.0–322.0)
Iron: 73 ug/dL (ref 42–165)
Saturation Ratios: 23.1 % (ref 20.0–50.0)
Transferrin: 226 mg/dL (ref 212.0–360.0)

## 2019-06-11 ENCOUNTER — Ambulatory Visit: Payer: Self-pay | Admitting: Surgery

## 2019-06-11 NOTE — H&P (Signed)
History of Present Illness Eric Dorsey. Reise Hietala MD; 06/11/2019 11:11 AM) The patient is a 63 year old male who presents with an inguinal hernia. Referred by Charlott Holler, NP for left inguinal hernia  This is a 63 year old male who presents with a 4 month history of a visible bulge in his left groin. The bulge remains reducible when he is supine. It seems to be enlarging. He does notice some more difficulty when having bowel movements. No urinary symptoms. No imaging.  The patient has a history of throat cancer status post excision, radiation, and chemotherapy. He does have a scar from old PEG tube for feeding during his cancer treatment. Last year he had a small recurrence on the base of his tongue and underwent another surgery to excise this area.   Past Surgical History Mammie Lorenzo, LPN; 05/03/8415 6:06 AM) Knee Surgery Right.  Diagnostic Studies History Mammie Lorenzo, LPN; 02/13/6009 9:32 AM) Colonoscopy 1-5 years ago  Allergies Mammie Lorenzo, LPN; 3/55/7322 0:25 AM) No Known Drug Allergies [06/11/2019]: Allergies Reconciled  Medication History Mammie Lorenzo, LPN; 04/11/622 7:62 AM) Metoprolol Succinate ER (25MG  Tablet ER 24HR, Oral) Active. Feosol (200 (65 Fe)MG Tablet, Oral) Active. Medications Reconciled  Social History Mammie Lorenzo, LPN; 08/16/5175 1:60 AM) Alcohol use Occasional alcohol use. Caffeine use Carbonated beverages, Coffee, Tea. No drug use Tobacco use Current some day smoker.  Family History Mammie Lorenzo, LPN; 7/37/1062 6:94 AM) Alcohol Abuse Brother, Son. Bleeding disorder Mother, Sister. Breast Cancer Mother. Cancer Brother. Colon Cancer Brother. Depression Sister.  Other Problems Mammie Lorenzo, LPN; 8/54/6270 3:50 AM) Alcohol Abuse Asthma Back Pain Cancer Chest pain Gastroesophageal Reflux Disease High blood pressure     Review of Systems Claiborne Billings Dockery LPN; 0/93/8182 9:93 AM) General Not Present- Appetite Loss,  Chills, Fatigue, Fever, Night Sweats, Weight Gain and Weight Loss. Skin Present- New Lesions. Not Present- Change in Wart/Mole, Dryness, Hives, Jaundice, Non-Healing Wounds, Rash and Ulcer. HEENT Present- Hoarseness, Ringing in the Ears and Sore Throat. Not Present- Earache, Hearing Loss, Nose Bleed, Oral Ulcers, Seasonal Allergies, Sinus Pain, Visual Disturbances, Wears glasses/contact lenses and Yellow Eyes. Respiratory Not Present- Bloody sputum, Chronic Cough, Difficulty Breathing, Snoring and Wheezing. Cardiovascular Not Present- Chest Pain, Difficulty Breathing Lying Down, Leg Cramps, Palpitations, Rapid Heart Rate, Shortness of Breath and Swelling of Extremities. Gastrointestinal Present- Difficulty Swallowing and Indigestion. Not Present- Abdominal Pain, Bloating, Bloody Stool, Change in Bowel Habits, Chronic diarrhea, Constipation, Excessive gas, Gets full quickly at meals, Hemorrhoids, Nausea, Rectal Pain and Vomiting. Male Genitourinary Not Present- Blood in Urine, Change in Urinary Stream, Frequency, Impotence, Nocturia, Painful Urination, Urgency and Urine Leakage. Musculoskeletal Not Present- Back Pain, Joint Pain, Joint Stiffness, Muscle Pain, Muscle Weakness and Swelling of Extremities. Neurological Not Present- Decreased Memory, Fainting, Headaches, Numbness, Seizures, Tingling, Tremor, Trouble walking and Weakness. Psychiatric Not Present- Anxiety, Bipolar, Change in Sleep Pattern, Depression, Fearful and Frequent crying. Endocrine Not Present- Cold Intolerance, Excessive Hunger, Hair Changes, Heat Intolerance, Hot flashes and New Diabetes. Hematology Not Present- Blood Thinners, Easy Bruising, Excessive bleeding, Gland problems, HIV and Persistent Infections.  Vitals Claiborne Billings Dockery LPN; 06/30/9677 9:38 AM) 06/11/2019 8:56 AM Weight: 139.8 lb Height: 67in Body Surface Area: 1.74 m Body Mass Index: 21.9 kg/m  Temp.: 98.38F(Oral)  Pulse: 82 (Regular)  BP: 148/84  (Sitting, Left Arm, Standard)        Physical Exam Rodman Key K. Shann Lewellyn MD; 06/11/2019 11:12 AM)  The physical exam findings are as follows: Note:WDWN in NAD Eyes: Pupils equal, round; sclera anicteric HENT: Oral  mucosa moist; good dentition Neck: No masses palpated, no thyromegaly Lungs: CTA bilaterally; normal respiratory effort CV: Regular rate and rhythm; no murmurs; extremities well-perfused with no edema Abd: +bowel sounds, soft, non-tender, no palpable organomegaly; no palpable hernias GU: Bilateral descended testes, no testicular masses, visible reducible left inguinal hernia. No sign of right inguinal hernia. Skin: Warm, dry; no sign of jaundice Psychiatric - alert and oriented x 4; calm mood and affect    Assessment & Plan Rodman Key K. Camey Edell MD; 06/11/2019 11:13 AM)  INGUINAL HERNIA OF LEFT SIDE WITHOUT OBSTRUCTION OR GANGRENE (K40.90)  Current Plans Schedule for Surgery - Left inguinal hernia repair with mesh. The surgical procedure has been discussed with the patient. Potential risks, benefits, alternative treatments, and expected outcomes have been explained. All of the patient's questions at this time have been answered. The likelihood of reaching the patient's treatment goal is good. The patient understand the proposed surgical procedure and wishes to proceed.  We will schedule the surgery at [facility] because of his airway issues due to surgery and radiation.  Eric Dorsey. Georgette Dover, MD, [facility]  General/ Trauma Surgery Beeper [phone]  06/11/2019 11:14 AM

## 2019-07-27 NOTE — Pre-Procedure Instructions (Signed)
[facility] [phone] - [place], [place] - [address] AT [place] [address] Phone: [phone] Fax: [phone]      Your procedure is scheduled on  08-04-19  Report to [facility] Main Entrance "A" at [place] and check in at the Admitting office.  Call this number if you have problems the morning of surgery:  [phone]  Call [phone] if you have any questions prior to your surgery date Monday-Friday 8am-4pm    Remember:  Do not eat  after midnight the night before your surgery  You may drink clear liquids until 0530AM the morning of your surgery.   Clear liquids allowed are: Water, Non-Citrus Juices (without pulp), Carbonated Beverages, Clear Tea, Black Coffee Only, and Gatorade   Take these medicines the morning of surgery with A SIP OF WATER : metoprolol succinate (TOPROL XL) lansoprazole (PREVACID) acetaminophen (TYLENOL) as needed diphenhydrAMINE (BENADRYL)as needed  7 days prior to surgery STOP taking any Aspirin (unless otherwise instructed by your surgeon), Aleve, Naproxen, Ibuprofen, Motrin, Advil, Goody's, BC's, all herbal medications, fish oil, and all vitamins.   The Morning of Surgery  Do not wear jewelry.  Do not wear lotions, powders, or colognes.  Men may shave face and neck.  Do not bring valuables to the hospital.  [facility] is not responsible for any belongings or valuables.  If you are a smoker, DO NOT Smoke 24 hours prior to surgery IF you wear a CPAP at night please bring your mask, tubing, and machine the morning of surgery   Remember that you must have someone to transport you home after your surgery, and remain with you for 24 hours if you are discharged the same day.   Contacts, glasses, hearing aids, dentures or bridgework may not be worn into surgery.    Leave your suitcase in the car.  After surgery it may be brought to your room.  For patients admitted  to the hospital, discharge time will be determined by your treatment team.  Patients discharged the day of surgery will not be allowed to drive home.    Special instructions:   - Preparing For Surgery  Before surgery, you can play an important role. Because skin is not sterile, your skin needs to be as free of germs as possible. You can reduce the number of germs on your skin by washing with CHG (chlorahexidine gluconate) Soap before surgery.  CHG is an antiseptic cleaner which kills germs and bonds with the skin to continue killing germs even after washing.    Oral Hygiene is also important to reduce your risk of infection.  Remember - BRUSH YOUR TEETH THE MORNING OF SURGERY WITH YOUR REGULAR TOOTHPASTE  Please do not use if you have an allergy to CHG or antibacterial soaps. If your skin becomes reddened/irritated stop using the CHG.  Do not shave (including legs and underarms) for at least 48 hours prior to first CHG shower. It is OK to shave your face.  Please follow these instructions carefully.   1. Shower the NIGHT BEFORE SURGERY and the MORNING OF SURGERY with CHG Soap.   2. If you chose to wash your hair, wash your hair first as usual with your normal shampoo.  3. After you shampoo, rinse your hair and body thoroughly to remove the shampoo.  4. Use CHG as you would any other liquid soap. You can apply CHG directly to the skin and wash gently with  a scrungie or a clean washcloth.   5. Apply the CHG Soap to your body ONLY FROM THE NECK DOWN.  Do not use on open wounds or open sores. Avoid contact with your eyes, ears, mouth and genitals (private parts). Wash Face and genitals (private parts)  with your normal soap.   6. Wash thoroughly, paying special attention to the area where your surgery will be performed.  7. Thoroughly rinse your body with warm water from the neck down.  8. DO NOT shower/wash with your normal soap after using and rinsing off the CHG  Soap.  9. Pat yourself dry with a CLEAN TOWEL.  10. Wear CLEAN PAJAMAS to bed the night before surgery, wear comfortable clothes the morning of surgery  11. Place CLEAN SHEETS on your bed the night of your first shower and DO NOT SLEEP WITH PETS.  Day of Surgery:  Do not apply any deodorants/lotions. Please shower the morning of surgery with the CHG soap  Please wear clean clothes to the hospital/surgery center.   Remember to brush your teeth WITH YOUR REGULAR TOOTHPASTE.   Please read over the  fact sheets that you were given.

## 2019-07-28 ENCOUNTER — Encounter (HOSPITAL_COMMUNITY)
Admission: RE | Admit: 2019-07-28 | Discharge: 2019-07-28 | Disposition: A | Discharge status: Home / Self Care | Payer: Medicaid Other | Source: Ambulatory Visit | Attending: Surgery | Admitting: Surgery

## 2019-07-28 ENCOUNTER — Encounter (HOSPITAL_COMMUNITY): Payer: Self-pay

## 2019-07-28 ENCOUNTER — Other Ambulatory Visit: Payer: Self-pay

## 2019-07-28 DIAGNOSIS — Z01818 Encounter for other preprocedural examination: Secondary | ICD-10-CM | POA: Diagnosis present

## 2019-07-28 HISTORY — DX: Essential (primary) hypertension: I10

## 2019-07-28 LAB — COMPREHENSIVE METABOLIC PANEL
ALT: 13 U/L (ref 0–44)
AST: 18 U/L (ref 15–41)
Albumin: 3.9 g/dL (ref 3.5–5.0)
Alkaline Phosphatase: 84 U/L (ref 38–126)
Anion gap: 8 (ref 5–15)
BUN: 9 mg/dL (ref 8–23)
CO2: 25 mmol/L (ref 22–32)
Calcium: 9.2 mg/dL (ref 8.9–10.3)
Chloride: 104 mmol/L (ref 98–111)
Creatinine, Ser: 0.92 mg/dL (ref 0.61–1.24)
GFR calc Af Amer: >60 mL/min (ref >60)
GFR calc non Af Amer: >60 mL/min (ref >60)
Glucose, Bld: 104 mg/dL — ABNORMAL HIGH (ref 70–99)
Potassium: 3.9 mmol/L (ref 3.5–5.1)
Sodium: 137 mmol/L (ref 135–145)
Total Bilirubin: 0.4 mg/dL (ref 0.3–1.2)
Total Protein: 7.5 g/dL (ref 6.5–8.1)

## 2019-07-28 LAB — CBC
HCT: 39 % (ref 39.0–52.0)
Hemoglobin: 13 g/dL (ref 13.0–17.0)
MCH: 31.8 pg (ref 26.0–34.0)
MCHC: 33.3 g/dL (ref 30.0–36.0)
MCV: 95.4 fL (ref 80.0–100.0)
Platelets: 329 10*3/uL (ref 150–400)
RBC: 4.09 MIL/uL — ABNORMAL LOW (ref 4.22–5.81)
RDW: 16 % — ABNORMAL HIGH (ref 11.5–15.5)
WBC: 5 10*3/uL (ref 4.0–10.5)
nRBC: 0 % (ref 0.0–0.2)

## 2019-07-28 NOTE — Progress Notes (Signed)
PCP - [facility] Cardiologist - denies  Chest x-ray - denies EKG  - 07/28/2019  Stress Test - denies ECHO - denies Cardiac Cath - denies  Sleep Study - denies CPAP - N/A    Blood Thinner Instructions: N/A Aspirin Instructions: N/A  Anesthesia review: No  Coronavirus Screening  Have you experienced the following symptoms:  Cough yes/no: No Fever (>100.85F)  yes/no: No Runny nose yes/no: No Sore throat yes/no: No Difficulty breathing/shortness of breath  yes/no: No  Have you or a family member traveled in the last 14 days and where? yes/no: No   If the patient indicates "YES" to the above questions, their PAT will be rescheduled to limit the exposure to others and, the surgeon will be notified. THE PATIENT WILL NEED TO BE ASYMPTOMATIC FOR 14 DAYS.   If the patient is not experiencing any of these symptoms, the PAT nurse will instruct them to NOT bring anyone with them to their appointment since they may have these symptoms or traveled as well.   Please remind your patients and families that hospital visitation restrictions are in effect and the importance of the restrictions.   Patient denies shortness of breath, fever, cough and chest pain at PAT appointment  Patient verbalized understanding of instructions that were given to them at the PAT appointment. Patient was also instructed that they will need to review over the PAT instructions again at home before surgery.

## 2019-07-31 ENCOUNTER — Other Ambulatory Visit (HOSPITAL_COMMUNITY)
Admission: RE | Admit: 2019-07-31 | Discharge: 2019-07-31 | Disposition: A | Discharge status: Home / Self Care | Payer: Medicaid Other | Source: Ambulatory Visit | Attending: Surgery | Admitting: Surgery

## 2019-07-31 DIAGNOSIS — Z20828 Contact with and (suspected) exposure to other viral communicable diseases: Secondary | ICD-10-CM | POA: Diagnosis not present

## 2019-07-31 DIAGNOSIS — Z01812 Encounter for preprocedural laboratory examination: Secondary | ICD-10-CM | POA: Insufficient documentation

## 2019-07-31 LAB — SARS CORONAVIRUS 2 (TAT 6-24 HRS): SARS Coronavirus 2: NEGATIVE

## 2019-08-03 NOTE — Anesthesia Preprocedure Evaluation (Addendum)
Anesthesia Evaluation  Patient identified by MRN, date of birth, ID band Patient awake    Reviewed: Allergy & Precautions, NPO status , Patient's Chart, lab work & pertinent test results, reviewed documented beta blocker date and time   History of Anesthesia Complications Negative for: history of anesthetic complications  Airway Mallampati: II  TM Distance: >3 FB Neck ROM: Limited    Dental  (+) Dental Advisory Given   Pulmonary former smoker,  07/31/2019 SARS coronavirus NEG   breath sounds clear to auscultation       Cardiovascular hypertension, Pt. on medications and Pt. on home beta blockers (-) angina Rhythm:Regular Rate:Normal     Neuro/Psych negative neurological ROS     GI/Hepatic Neg liver ROS, GERD  Medicated and Controlled,  Endo/Other  Hypothyroidism   Renal/GU negative Renal ROS     Musculoskeletal   Abdominal   Peds  Hematology negative hematology ROS (+)   Anesthesia Other Findings Supraglottic cancer: XRT  Reproductive/Obstetrics                            Anesthesia Physical Anesthesia Plan  ASA: III  Anesthesia Plan: General   Post-op Pain Management: GA combined w/ Regional for post-op pain   Induction: Intravenous  PONV Risk Score and Plan: 2 and Ondansetron and Dexamethasone  Airway Management Planned: Oral ETT  Additional Equipment:   Intra-op Plan:   Post-operative Plan:   Informed Consent: I have reviewed the patients History and Physical, chart, labs and discussed the procedure including the risks, benefits and alternatives for the proposed anesthesia with the patient or authorized representative who has indicated his/her understanding and acceptance.     Dental advisory given  Plan Discussed with: CRNA and Surgeon  Anesthesia Plan Comments: (Plan routine monitors, TAP block for post op analgesia)       Anesthesia Quick Evaluation

## 2019-08-04 ENCOUNTER — Ambulatory Visit (HOSPITAL_COMMUNITY): Payer: Medicaid Other | Admitting: Anesthesiology

## 2019-08-04 ENCOUNTER — Encounter (HOSPITAL_COMMUNITY)
Admission: RE | Disposition: A | Discharge status: Home / Self Care | Payer: Self-pay | Source: Ambulatory Visit | Attending: Surgery

## 2019-08-04 ENCOUNTER — Other Ambulatory Visit: Payer: Self-pay

## 2019-08-04 ENCOUNTER — Ambulatory Visit (HOSPITAL_COMMUNITY)
Admission: RE | Admit: 2019-08-04 | Discharge: 2019-08-04 | Disposition: A | Discharge status: Home / Self Care | Payer: Medicaid Other | Source: Ambulatory Visit | Attending: Surgery | Admitting: Surgery

## 2019-08-04 ENCOUNTER — Encounter (HOSPITAL_COMMUNITY): Payer: Self-pay

## 2019-08-04 DIAGNOSIS — J45909 Unspecified asthma, uncomplicated: Secondary | ICD-10-CM | POA: Diagnosis not present

## 2019-08-04 DIAGNOSIS — Z85818 Personal history of malignant neoplasm of other sites of lip, oral cavity, and pharynx: Secondary | ICD-10-CM | POA: Insufficient documentation

## 2019-08-04 DIAGNOSIS — Z79899 Other long term (current) drug therapy: Secondary | ICD-10-CM | POA: Diagnosis not present

## 2019-08-04 DIAGNOSIS — E039 Hypothyroidism, unspecified: Secondary | ICD-10-CM | POA: Diagnosis not present

## 2019-08-04 DIAGNOSIS — Z923 Personal history of irradiation: Secondary | ICD-10-CM | POA: Insufficient documentation

## 2019-08-04 DIAGNOSIS — K4090 Unilateral inguinal hernia, without obstruction or gangrene, not specified as recurrent: Secondary | ICD-10-CM | POA: Diagnosis not present

## 2019-08-04 DIAGNOSIS — K219 Gastro-esophageal reflux disease without esophagitis: Secondary | ICD-10-CM | POA: Diagnosis not present

## 2019-08-04 DIAGNOSIS — Z87891 Personal history of nicotine dependence: Secondary | ICD-10-CM | POA: Diagnosis not present

## 2019-08-04 DIAGNOSIS — Z9221 Personal history of antineoplastic chemotherapy: Secondary | ICD-10-CM | POA: Diagnosis not present

## 2019-08-04 DIAGNOSIS — I10 Essential (primary) hypertension: Secondary | ICD-10-CM | POA: Insufficient documentation

## 2019-08-04 HISTORY — PX: INGUINAL HERNIA REPAIR: SHX194

## 2019-08-04 SURGERY — REPAIR, HERNIA, INGUINAL, ADULT
Anesthesia: General | Site: Inguinal | Laterality: Left

## 2019-08-04 MED ORDER — GABAPENTIN 300 MG PO CAPS
300.0000 mg | ORAL_CAPSULE | ORAL | Status: AC
  Administered 2019-08-04: 300 mg via ORAL
  Filled 2019-08-04: qty 1

## 2019-08-04 MED ORDER — 0.9 % SODIUM CHLORIDE (POUR BTL) OPTIME
TOPICAL | Status: DC | PRN
  Administered 2019-08-04: 09:00:00 1000 mL

## 2019-08-04 MED ORDER — FENTANYL CITRATE (PF) 100 MCG/2ML IJ SOLN: 25.0000 ug | INTRAMUSCULAR | Status: DC | PRN

## 2019-08-04 MED ORDER — PHENYLEPHRINE 40 MCG/ML (10ML) SYRINGE FOR IV PUSH (FOR BLOOD PRESSURE SUPPORT)
PREFILLED_SYRINGE | INTRAVENOUS | Status: AC
  Filled 2019-08-04: qty 10

## 2019-08-04 MED ORDER — PROPOFOL 10 MG/ML IV BOLUS
INTRAVENOUS | Status: DC | PRN
  Administered 2019-08-04: 100 mg via INTRAVENOUS

## 2019-08-04 MED ORDER — ONDANSETRON HCL 4 MG/2ML IJ SOLN
INTRAMUSCULAR | Status: DC | PRN
  Administered 2019-08-04: 4 mg via INTRAVENOUS

## 2019-08-04 MED ORDER — ROCURONIUM BROMIDE 10 MG/ML (PF) SYRINGE
PREFILLED_SYRINGE | INTRAVENOUS | Status: AC
  Filled 2019-08-04: qty 10

## 2019-08-04 MED ORDER — PROMETHAZINE HCL 25 MG/ML IJ SOLN: 6.2500 mg | INTRAMUSCULAR | Status: DC | PRN

## 2019-08-04 MED ORDER — SUCCINYLCHOLINE CHLORIDE 20 MG/ML IJ SOLN
INTRAMUSCULAR | Status: DC | PRN
  Administered 2019-08-04: 100 mg via INTRAVENOUS

## 2019-08-04 MED ORDER — LACTATED RINGERS IV SOLN
INTRAVENOUS | Status: DC | PRN
  Administered 2019-08-04: 08:00:00 via INTRAVENOUS

## 2019-08-04 MED ORDER — BUPIVACAINE-EPINEPHRINE (PF) 0.25% -1:200000 IJ SOLN
INTRAMUSCULAR | Status: AC
  Filled 2019-08-04: qty 30

## 2019-08-04 MED ORDER — CHLORHEXIDINE GLUCONATE CLOTH 2 % EX PADS: 6.0000 | MEDICATED_PAD | Freq: Once | CUTANEOUS | Status: DC

## 2019-08-04 MED ORDER — SUCCINYLCHOLINE CHLORIDE 200 MG/10ML IV SOSY
PREFILLED_SYRINGE | INTRAVENOUS | Status: AC
  Filled 2019-08-04: qty 10

## 2019-08-04 MED ORDER — PHENYLEPHRINE 40 MCG/ML (10ML) SYRINGE FOR IV PUSH (FOR BLOOD PRESSURE SUPPORT)
PREFILLED_SYRINGE | INTRAVENOUS | Status: DC | PRN
  Administered 2019-08-04: 120 ug via INTRAVENOUS

## 2019-08-04 MED ORDER — STERILE WATER FOR IRRIGATION IR SOLN
Status: DC | PRN
  Administered 2019-08-04: 1000 mL

## 2019-08-04 MED ORDER — FENTANYL CITRATE (PF) 100 MCG/2ML IJ SOLN
INTRAMUSCULAR | Status: DC | PRN
  Administered 2019-08-04 (×2): 50 ug via INTRAVENOUS

## 2019-08-04 MED ORDER — OXYCODONE HCL 5 MG PO TABS: 5.0000 mg | ORAL_TABLET | Freq: Four times a day (QID) | ORAL | 0 refills | Status: DC | PRN

## 2019-08-04 MED ORDER — LIDOCAINE 2% (20 MG/ML) 5 ML SYRINGE
INTRAMUSCULAR | Status: DC | PRN
  Administered 2019-08-04: 30 mg via INTRAVENOUS

## 2019-08-04 MED ORDER — DEXAMETHASONE SODIUM PHOSPHATE 10 MG/ML IJ SOLN
INTRAMUSCULAR | Status: DC | PRN
  Administered 2019-08-04: 10 mg via INTRAVENOUS

## 2019-08-04 MED ORDER — MIDAZOLAM HCL 2 MG/2ML IJ SOLN
INTRAMUSCULAR | Status: AC
  Filled 2019-08-04: qty 2

## 2019-08-04 MED ORDER — ONDANSETRON HCL 4 MG/2ML IJ SOLN
INTRAMUSCULAR | Status: AC
  Filled 2019-08-04: qty 2

## 2019-08-04 MED ORDER — DEXAMETHASONE SODIUM PHOSPHATE 10 MG/ML IJ SOLN
INTRAMUSCULAR | Status: AC
  Filled 2019-08-04: qty 1

## 2019-08-04 MED ORDER — ACETAMINOPHEN 500 MG PO TABS
1000.0000 mg | ORAL_TABLET | ORAL | Status: AC
  Administered 2019-08-04: 1000 mg via ORAL
  Filled 2019-08-04: qty 2

## 2019-08-04 MED ORDER — FENTANYL CITRATE (PF) 250 MCG/5ML IJ SOLN
INTRAMUSCULAR | Status: AC
  Filled 2019-08-04: qty 5

## 2019-08-04 MED ORDER — BUPIVACAINE-EPINEPHRINE (PF) 0.5% -1:200000 IJ SOLN
INTRAMUSCULAR | Status: DC | PRN
  Administered 2019-08-04: 30 mL

## 2019-08-04 MED ORDER — ROCURONIUM BROMIDE 10 MG/ML (PF) SYRINGE
PREFILLED_SYRINGE | INTRAVENOUS | Status: DC | PRN
  Administered 2019-08-04: 50 mg via INTRAVENOUS

## 2019-08-04 MED ORDER — MEPERIDINE HCL 25 MG/ML IJ SOLN: 6.2500 mg | INTRAMUSCULAR | Status: DC | PRN

## 2019-08-04 MED ORDER — BUPIVACAINE-EPINEPHRINE 0.25% -1:200000 IJ SOLN
INTRAMUSCULAR | Status: DC | PRN
  Administered 2019-08-04: 10 mL

## 2019-08-04 MED ORDER — MIDAZOLAM HCL 2 MG/2ML IJ SOLN: 0.5000 mg | Freq: Once | INTRAMUSCULAR | Status: DC | PRN

## 2019-08-04 MED ORDER — MIDAZOLAM HCL 2 MG/2ML IJ SOLN
INTRAMUSCULAR | Status: DC | PRN
  Administered 2019-08-04: 1 mg via INTRAVENOUS

## 2019-08-04 MED ORDER — CEFAZOLIN SODIUM-DEXTROSE 2-4 GM/100ML-% IV SOLN
2.0000 g | INTRAVENOUS | Status: AC
  Administered 2019-08-04: 2 g via INTRAVENOUS
  Filled 2019-08-04: qty 100

## 2019-08-04 MED ORDER — PROPOFOL 10 MG/ML IV BOLUS
INTRAVENOUS | Status: AC
  Filled 2019-08-04: qty 20

## 2019-08-04 MED ORDER — SUGAMMADEX SODIUM 200 MG/2ML IV SOLN
INTRAVENOUS | Status: DC | PRN
  Administered 2019-08-04: 200 mg via INTRAVENOUS

## 2019-08-04 MED ORDER — LIDOCAINE 2% (20 MG/ML) 5 ML SYRINGE
INTRAMUSCULAR | Status: AC
  Filled 2019-08-04: qty 5

## 2019-08-04 SURGICAL SUPPLY — 47 items
APL PRP STRL LF DISP 70% ISPRP (MISCELLANEOUS) ×1
APL SKNCLS STERI-STRIP NONHPOA (GAUZE/BANDAGES/DRESSINGS) ×1
BENZOIN TINCTURE PRP APPL 2/3 (GAUZE/BANDAGES/DRESSINGS) ×3 IMPLANT
BLADE CLIPPER SURG (BLADE) IMPLANT
CHLORAPREP W/TINT 26 (MISCELLANEOUS) ×3 IMPLANT
CLOSURE STERI-STRIP 1/4X4 (GAUZE/BANDAGES/DRESSINGS) ×2 IMPLANT
CLOSURE WOUND 1/2 X4 (GAUZE/BANDAGES/DRESSINGS) ×1
COVER SURGICAL LIGHT HANDLE (MISCELLANEOUS) ×3 IMPLANT
COVER WAND RF STERILE (DRAPES) ×3 IMPLANT
DRAIN PENROSE 1/2X12 LTX STRL (WOUND CARE) IMPLANT
DRAPE LAPAROSCOPIC ABDOMINAL (DRAPES) IMPLANT
DRAPE LAPAROTOMY TRNSV 102X78 (DRAPES) ×2 IMPLANT
DRSG TEGADERM 4X4.75 (GAUZE/BANDAGES/DRESSINGS) ×3 IMPLANT
ELECT CAUTERY BLADE 6.4 (BLADE) IMPLANT
ELECT REM PT RETURN 9FT ADLT (ELECTROSURGICAL) ×3
ELECTRODE REM PT RTRN 9FT ADLT (ELECTROSURGICAL) ×1 IMPLANT
GAUZE 4X4 16PLY RFD (DISPOSABLE) ×3 IMPLANT
GAUZE SPONGE 4X4 12PLY STRL (GAUZE/BANDAGES/DRESSINGS) ×3 IMPLANT
GLOVE BIO SURGEON STRL SZ7 (GLOVE) ×3 IMPLANT
GLOVE BIOGEL PI IND STRL 7.5 (GLOVE) ×1 IMPLANT
GLOVE BIOGEL PI INDICATOR 7.5 (GLOVE) ×2
GOWN STRL REUS W/ TWL LRG LVL3 (GOWN DISPOSABLE) ×2 IMPLANT
GOWN STRL REUS W/TWL LRG LVL3 (GOWN DISPOSABLE) ×6
KIT BASIN OR (CUSTOM PROCEDURE TRAY) ×3 IMPLANT
KIT TURNOVER KIT B (KITS) ×3 IMPLANT
MESH PARIETEX PROGRIP LEFT (Mesh General) ×2 IMPLANT
NDL HYPO 25GX1X1/2 BEV (NEEDLE) ×1 IMPLANT
NEEDLE HYPO 25GX1X1/2 BEV (NEEDLE) ×3 IMPLANT
NS IRRIG 1000ML POUR BTL (IV SOLUTION) ×3 IMPLANT
PACK GENERAL/GYN (CUSTOM PROCEDURE TRAY) ×3 IMPLANT
PAD ARMBOARD 7.5X6 YLW CONV (MISCELLANEOUS) ×3 IMPLANT
PENCIL SMOKE EVACUATOR (MISCELLANEOUS) ×3 IMPLANT
SPONGE INTESTINAL PEANUT (DISPOSABLE) ×3 IMPLANT
STRIP CLOSURE SKIN 1/2X4 (GAUZE/BANDAGES/DRESSINGS) ×2 IMPLANT
SUT MNCRL AB 4-0 PS2 18 (SUTURE) ×3 IMPLANT
SUT PDS AB 0 CT 36 (SUTURE) IMPLANT
SUT SILK 2 0 SH (SUTURE) IMPLANT
SUT SILK 3 0 (SUTURE)
SUT SILK 3-0 18XBRD TIE 12 (SUTURE) IMPLANT
SUT VIC AB 0 CT2 27 (SUTURE) ×3 IMPLANT
SUT VIC AB 2-0 SH 27 (SUTURE) ×3
SUT VIC AB 2-0 SH 27X BRD (SUTURE) ×1 IMPLANT
SUT VIC AB 3-0 SH 27 (SUTURE) ×3
SUT VIC AB 3-0 SH 27XBRD (SUTURE) ×1 IMPLANT
SYR CONTROL 10ML LL (SYRINGE) ×3 IMPLANT
TOWEL GREEN STERILE (TOWEL DISPOSABLE) ×3 IMPLANT
TOWEL GREEN STERILE FF (TOWEL DISPOSABLE) ×3 IMPLANT

## 2019-08-04 NOTE — Anesthesia Procedure Notes (Signed)
Procedure Name: Intubation Date/Time: 08/04/2019 8:43 AM Performed by: Barrington Ellison, CRNA Pre-anesthesia Checklist: Patient identified, Emergency Drugs available, Suction available and Patient being monitored Patient Re-evaluated:Patient Re-evaluated prior to induction Oxygen Delivery Method: Circle System Utilized Preoxygenation: Pre-oxygenation with 100% oxygen Induction Type: IV induction and Rapid sequence Laryngoscope Size: Glidescope and 4 Grade View: Grade I Tube type: Oral Tube size: 6.0 mm Number of attempts: 1 Airway Equipment and Method: Stylet and Oral airway Placement Confirmation: ETT inserted through vocal cords under direct vision,  positive ETCO2 and breath sounds checked- equal and bilateral Secured at: 22 cm Tube secured with: Tape Dental Injury: Teeth and Oropharynx as per pre-operative assessment  Difficulty Due To: Difficulty was anticipated Future Recommendations: Recommend- induction with short-acting agent, and alternative techniques readily available Comments: Elective Glidescope and 6.0 ETT use d/t previous radiation treatment and patient history

## 2019-08-04 NOTE — Anesthesia Postprocedure Evaluation (Signed)
Anesthesia Post Note  Patient: Eric Dorsey  Procedure(s) Performed: LEFT INGUINAL HERNIA REPAIR WITH MESH (Left Inguinal)     Patient location during evaluation: PACU Anesthesia Type: General Level of consciousness: awake and alert, patient cooperative and oriented Pain management: pain level controlled Vital Signs Assessment: post-procedure vital signs reviewed and stable Respiratory status: spontaneous breathing, nonlabored ventilation and respiratory function stable Cardiovascular status: blood pressure returned to baseline and stable Postop Assessment: no apparent nausea or vomiting Anesthetic complications: no    Last Vitals:  Vitals:   08/04/19 0950 08/04/19 1005  BP: (!) 188/99 (!) 178/96  Pulse: 79 74  Resp: 13 13  Temp: 36.5 C 36.6 C  SpO2: 100% 99%    Last Pain:  Vitals:   08/04/19 1005  TempSrc:   PainSc: 0-No pain                 Tonee Silverstein,E. Ohn Bostic

## 2019-08-04 NOTE — Discharge Instructions (Signed)
[facility]  UMBILICAL OR INGUINAL HERNIA REPAIR: POST OP INSTRUCTIONS  Always review your discharge instruction sheet given to you by the facility where your surgery was performed. IF YOU HAVE DISABILITY OR FAMILY LEAVE FORMS, YOU MUST BRING THEM TO THE OFFICE FOR PROCESSING.   DO NOT GIVE THEM TO YOUR DOCTOR.  1. A  prescription for pain medication may be given to you upon discharge.  Take your pain medication as prescribed, if needed.  If narcotic pain medicine is not needed, then you may take acetaminophen (Tylenol) or ibuprofen (Advil) as needed. 2. Take your usually prescribed medications unless otherwise directed. 3. If you need a refill on your pain medication, please contact your pharmacy.  They will contact our office to request authorization. Prescriptions will not be filled after 5 pm or on week-ends. 4. You should follow a light diet the first 24 hours after arrival home, such as soup and crackers, etc.  Be sure to include lots of fluids daily.  Resume your normal diet the day after surgery. 5. Most patients will experience some swelling and bruising around the umbilicus or in the groin and scrotum.  Ice packs and reclining will help.  Swelling and bruising can take several days to resolve.  6. It is common to experience some constipation if taking pain medication after surgery.  Increasing fluid intake and taking a stool softener (such as Colace) will usually help or prevent this problem from occurring.  A mild laxative (Milk of Magnesia or Miralax) should be taken according to package directions if there are no bowel movements after 48 hours. 7. Unless discharge instructions indicate otherwise, you may remove your bandages 24-48 hours after surgery, and you may shower at that time.  You will have steri-strips (small skin tapes) in place directly over the incision.  These strips should be left on the skin for 7-10 days. 8. ACTIVITIES:  You may resume regular (light)  daily activities beginning the next day--such as daily self-care, walking, climbing stairs--gradually increasing activities as tolerated.  You may have sexual intercourse when it is comfortable.  Refrain from any heavy lifting or straining until approved by your doctor. a. You may drive when you are no longer taking prescription pain medication, you can comfortably wear a seatbelt, and you can safely maneuver your car and apply brakes. b. RETURN TO WORK:  2-3 weeks with light duty - no lifting over 15 lbs. 9. You should see your doctor in the office for a follow-up appointment approximately 2-3 weeks after your surgery.  Make sure that you call for this appointment within a day or two after you arrive home to insure a convenient appointment time. 10. OTHER INSTRUCTIONS:  __________________________________________________________________________________________________________________________________________________________________________________________  WHEN TO CALL YOUR DOCTOR: 1. Fever over 101.0 2. Inability to urinate 3. Nausea and/or vomiting 4. Extreme swelling or bruising 5. Continued bleeding from incision. 6. Increased pain, redness, or drainage from the incision  The clinic staff is available to answer your questions during regular business hours.  Please don't hesitate to call and ask to speak to one of the nurses for clinical concerns.  If you have a medical emergency, go to the nearest emergency room or call 911.  A surgeon from [facility] is always on call at the hospital   [address], [address], [place], [place]  [address] ?  [address] [address], [place], [place]   [address] [phone]    [phone]    FAX [phone] Web   site: [email or web address]    

## 2019-08-04 NOTE — H&P (Signed)
History of Present Illness The patient is a 63 year old male who presents with an inguinal hernia. Referred by Charlott Holler, NP for left inguinal hernia  This is a 63 year old male who presents with a 4 month history of a visible bulge in his left groin. The bulge remains reducible when he is supine. It seems to be enlarging. He does notice some more difficulty when having bowel movements. No urinary symptoms. No imaging.  The patient has a history of throat cancer status post excision, radiation, and chemotherapy. He does have a scar from old PEG tube for feeding during his cancer treatment. Last year he had a small recurrence on the base of his tongue and underwent another surgery to excise this area.   Past Surgical History  Knee Surgery Right.  Diagnostic Studies History  Colonoscopy 1-5 years ago  Allergies No Known Drug Allergies : Allergies Reconciled  Medication History  Metoprolol Succinate ER (25MG  Tablet ER 24HR, Oral) Active. Feosol (200 (65 Fe)MG Tablet, Oral) Active. Medications Reconciled  Social Histor Alcohol use Occasional alcohol use. Caffeine use Carbonated beverages, Coffee, Tea. No drug use Tobacco use Current some day smoker.  Family History Alcohol Abuse Brother, Son. Bleeding disorder Mother, Sister. Breast Cancer Mother. Cancer Brother. Colon Cancer Brother. Depression Sister.  Other Problems Alcohol Abuse Asthma Back Pain Cancer Chest pain Gastroesophageal Reflux Disease High blood pressure     Review of Systems General Not Present- Appetite Loss, Chills, Fatigue, Fever, Night Sweats, Weight Gain and Weight Loss. Skin Present- New Lesions. Not Present- Change in Wart/Mole, Dryness, Hives, Jaundice, Non-Healing Wounds, Rash and Ulcer. HEENT Present- Hoarseness, Ringing in the Ears and Sore Throat. Not Present- Earache, Hearing Loss, Nose Bleed, Oral Ulcers, Seasonal Allergies, Sinus Pain, Visual  Disturbances, Wears glasses/contact lenses and Yellow Eyes. Respiratory Not Present- Bloody sputum, Chronic Cough, Difficulty Breathing, Snoring and Wheezing. Cardiovascular Not Present- Chest Pain, Difficulty Breathing Lying Down, Leg Cramps, Palpitations, Rapid Heart Rate, Shortness of Breath and Swelling of Extremities. Gastrointestinal Present- Difficulty Swallowing and Indigestion. Not Present- Abdominal Pain, Bloating, Bloody Stool, Change in Bowel Habits, Chronic diarrhea, Constipation, Excessive gas, Gets full quickly at meals, Hemorrhoids, Nausea, Rectal Pain and Vomiting. Male Genitourinary Not Present- Blood in Urine, Change in Urinary Stream, Frequency, Impotence, Nocturia, Painful Urination, Urgency and Urine Leakage. Musculoskeletal Not Present- Back Pain, Joint Pain, Joint Stiffness, Muscle Pain, Muscle Weakness and Swelling of Extremities. Neurological Not Present- Decreased Memory, Fainting, Headaches, Numbness, Seizures, Tingling, Tremor, Trouble walking and Weakness. Psychiatric Not Present- Anxiety, Bipolar, Change in Sleep Pattern, Depression, Fearful and Frequent crying. Endocrine Not Present- Cold Intolerance, Excessive Hunger, Hair Changes, Heat Intolerance, Hot flashes and New Diabetes. Hematology Not Present- Blood Thinners, Easy Bruising, Excessive bleeding, Gland problems, HIV and Persistent Infections.  Vitals Weight: 139.8 lb Height: 67in Body Surface Area: 1.74 m Body Mass Index: 21.9 kg/m  Temp.: 98.42F(Oral)  Pulse: 82 (Regular)  BP: 148/84 (Sitting, Left Arm, Standard)  Physical Exam   The physical exam findings are as follows: Note:WDWN in NAD Eyes: Pupils equal, round; sclera anicteric HENT: Oral mucosa moist; good dentition Neck: No masses palpated, no thyromegaly Lungs: CTA bilaterally; normal respiratory effort CV: Regular rate and rhythm; no murmurs; extremities well-perfused with no edema Abd: +bowel sounds, soft, non-tender, no  palpable organomegaly; no palpable hernias GU: Bilateral descended testes, no testicular masses, visible reducible left inguinal hernia. No sign of right inguinal hernia. Skin: Warm, dry; no sign of jaundice Psychiatric - alert and oriented x 4; calm mood  and affect    Assessment & Plan  INGUINAL HERNIA OF LEFT SIDE WITHOUT OBSTRUCTION OR GANGRENE (K40.90)  Current Plans Schedule for Surgery - Left inguinal hernia repair with mesh. The surgical procedure has been discussed with the patient. Potential risks, benefits, alternative treatments, and expected outcomes have been explained. All of the patient's questions at this time have been answered. The likelihood of reaching the patient's treatment goal is good. The patient understand the proposed surgical procedure and wishes to proceed.  We will schedule the surgery at [facility] because of his airway issues due to surgery and radiation.  Imogene Burn. Georgette Dover, MD, [facility]  General/ Trauma Surgery Beeper [phone]  08/04/2019 8:15 AM

## 2019-08-04 NOTE — Op Note (Signed)
Hernia, Open, Procedure Note  Indications: The patient presented with a history of a left, reducible inguinal hernia.    Pre-operative Diagnosis: left reducible inguinal hernia Post-operative Diagnosis: same  Surgeon: Maia Petties   Assistants: none  Anesthesia: General endotracheal anesthesia and TAP block  ASA Class: 2  Procedure Details  The patient was seen again in the Holding Room. The risks, benefits, complications, treatment options, and expected outcomes were discussed with the patient. The possibilities of reaction to medication, pulmonary aspiration, perforation of viscus, bleeding, recurrent infection, the need for additional procedures, and development of a complication requiring transfusion or further operation were discussed with the patient and/or family. The likelihood of success in repairing the hernia and returning the patient to their previous functional status is good.  There was concurrence with the proposed plan, and informed consent was obtained. The site of surgery was properly noted/marked. The patient was taken to the Operating Room, identified as Eric Dorsey, and the procedure verified as left inguinal hernia repair. A Time Out was held and the above information confirmed.  The patient was placed in the supine position and underwent induction of anesthesia. The lower abdomen and groin was prepped with Chloraprep and draped in the standard fashion, and 0.25% Marcaine with epinephrine was used to anesthetize the skin over the mid-portion of the inguinal canal. An oblique incision was made. Dissection was carried down through the subcutaneous tissue with cautery to the external oblique fascia.  We opened the external oblique fascia along the direction of its fibers to the external ring.  The spermatic cord was circumferentially dissected bluntly and retracted with a Penrose drain.  The ilioinguinal nerve was identified and preserved.  The floor of the inguinal canal  was inspected and was intact.  We skeletonized the spermatic cord and reduced a moderate-sized indirect hernia sac.  The internal ring was tightened with 0 Vicryl.  We used a left Progrip mesh which was inserted and deployed across the floor of the inguinal canal. The mesh was tucked underneath the external oblique fascia laterally.  The flap of the mesh was closed around the spermatic cord to recreate the internal inguinal ring.  The mesh was secured to the pubic tubercle with 0 Vicryl.  The inferior edge of the mesh was secured to the shelving edge with an additional stay suture.  The external oblique fascia was reapproximated with 2-0 Vicryl.  3-0 Vicryl was used to close the subcutaneous tissues and 4-0 Monocryl was used to close the skin in subcuticular fashion.  Benzoin and steri-strips were used to seal the incision.  A clean dressing was applied.  The patient was then extubated and brought to the recovery room in stable condition.  All sponge, instrument, and needle counts were correct prior to closure and at the conclusion of the case.   Estimated Blood Loss: Minimal                 Complications: None; patient tolerated the procedure well.         Disposition: PACU - hemodynamically stable.         Condition: stable  Eric Dorsey. Eric Dover, MD, [facility] [phone]  08/04/2019 9:52 AM

## 2019-08-04 NOTE — Anesthesia Procedure Notes (Signed)
Anesthesia Regional Block: TAP block   Pre-Anesthetic Checklist: ,, timeout performed, Correct Patient, Correct Site, Correct Laterality, Correct Procedure, Correct Position, site marked, Risks and benefits discussed,  Surgical consent,  Pre-op evaluation,  At surgeon's request and post-op pain management  Laterality: Left  Prep: chloraprep       Needles:  Injection technique: Single-shot  Needle Type: Echogenic Needle     Needle Length: 9cm  Needle Gauge: 21     Additional Needles:   Procedures:,,,, ultrasound used (permanent image in chart),,,,  Narrative:  Start time: 08/04/2019 8:21 AM End time: 08/04/2019 8:28 AM Injection made incrementally with aspirations every 5 mL.  Performed by: Personally  Anesthesiologist: Annye Asa, MD  Additional Notes: Pt identified in Holding room.  Monitors applied. Working IV access confirmed. Sterile prep, drape L flank.  #21ga ECHOgenic needle into TAP with US guidance.  30cc 0.5% Bupivacaine with 1:200k epi injected incrementally after negative test dose.  Patient asymptomatic, VSS, no heme aspirated, tolerated well.  Jenita Seashore, MD

## 2019-08-04 NOTE — Transfer of Care (Signed)
Immediate Anesthesia Transfer of Care Note  Patient: Eric Dorsey  Procedure(s) Performed: LEFT INGUINAL HERNIA REPAIR WITH MESH (Left Inguinal)  Patient Location: PACU  Anesthesia Type:General  Level of Consciousness: awake  Airway & Oxygen Therapy: Patient Spontanous Breathing and Patient connected to nasal cannula oxygen  Post-op Assessment: Report given to RN  Post vital signs: Reviewed and stable  Last Vitals:  Vitals Value Taken Time  BP    Temp    Pulse 79 08/04/19 0949  Resp    SpO2 100 % 08/04/19 0949  Vitals shown include unvalidated device data.  Last Pain:  Vitals:   08/04/19 0702  TempSrc:   PainSc: 0-No pain      Patients Stated Pain Goal: 0 (18/56/31 4970)  Complications: No apparent anesthesia complications

## 2019-08-05 ENCOUNTER — Encounter (HOSPITAL_COMMUNITY): Payer: Self-pay | Admitting: Surgery

## 2019-08-27 ENCOUNTER — Other Ambulatory Visit (INDEPENDENT_AMBULATORY_CARE_PROVIDER_SITE_OTHER): Payer: Medicaid Other

## 2019-08-27 DIAGNOSIS — D509 Iron deficiency anemia, unspecified: Secondary | ICD-10-CM | POA: Diagnosis not present

## 2019-08-27 LAB — FERRITIN: Ferritin: 50.1 ng/mL (ref 22.0–322.0)

## 2019-08-27 LAB — IBC PANEL
Iron: 49 ug/dL (ref 42–165)
Saturation Ratios: 14.7 % — ABNORMAL LOW (ref 20.0–50.0)
Transferrin: 238 mg/dL (ref 212.0–360.0)

## 2019-08-31 ENCOUNTER — Other Ambulatory Visit: Payer: Self-pay

## 2019-08-31 DIAGNOSIS — D509 Iron deficiency anemia, unspecified: Secondary | ICD-10-CM

## 2019-10-04 ENCOUNTER — Other Ambulatory Visit: Payer: Self-pay | Admitting: *Deleted

## 2019-10-04 DIAGNOSIS — I712 Thoracic aortic aneurysm, without rupture, unspecified: Secondary | ICD-10-CM

## 2019-10-04 MED ORDER — METOPROLOL SUCCINATE ER 25 MG PO TB24: 25.0000 mg | ORAL_TABLET | Freq: Every day | ORAL | 3 refills | Status: DC

## 2020-02-17 ENCOUNTER — Ambulatory Visit: Payer: Medicare HMO | Attending: Internal Medicine

## 2020-02-17 DIAGNOSIS — Z23 Encounter for immunization: Secondary | ICD-10-CM | POA: Insufficient documentation

## 2020-02-17 NOTE — Progress Notes (Signed)
   Covid-19 Vaccination Clinic  Name:  Eric Dorsey    MRN: [id number] DOB: 1956-09-10  02/17/2020  Mr. Eric Dorsey was observed post Covid-19 immunization for 15 minutes without incident. He was provided with Vaccine Information Sheet and instruction to access the V-Safe system.   Mr. Eric Dorsey was instructed to call 911 with any severe reactions post vaccine: Marland Kitchen Difficulty breathing  . Swelling of face and throat  . A fast heartbeat  . A bad rash all over body  . Dizziness and weakness   Immunizations Administered    Name Date Dose VIS Date Route   Pfizer COVID-19 Vaccine 02/17/2020  3:51 PM 0.3 mL 11/26/2019 Intramuscular   Manufacturer: [place]   Lot: [id number]   [place]: [id number]

## 2020-02-23 ENCOUNTER — Other Ambulatory Visit: Payer: Self-pay | Admitting: Thoracic Surgery (Cardiothoracic Vascular Surgery)

## 2020-02-23 DIAGNOSIS — I712 Thoracic aortic aneurysm, without rupture, unspecified: Secondary | ICD-10-CM

## 2020-03-14 ENCOUNTER — Ambulatory Visit: Payer: Medicare HMO

## 2020-03-15 ENCOUNTER — Ambulatory Visit: Payer: Medicare HMO | Attending: Internal Medicine

## 2020-03-15 DIAGNOSIS — Z23 Encounter for immunization: Secondary | ICD-10-CM

## 2020-03-15 NOTE — Progress Notes (Signed)
   Covid-19 Vaccination Clinic  Name:  Eric Dorsey    MRN: [id number] DOB: 1956-09-24  03/15/2020  [name] was observed post Covid-19 immunization for 15 minutes without incident. He was provided with Vaccine Information Sheet and instruction to access the V-Safe system.   [name] was instructed to call 911 with any severe reactions post vaccine: Marland Kitchen Difficulty breathing  . Swelling of face and throat  . A fast heartbeat  . A bad rash all over body  . Dizziness and weakness   Immunizations Administered    Name Date Dose VIS Date Route   Pfizer COVID-19 Vaccine 03/15/2020  9:46 AM 0.3 mL 11/26/2019 Intramuscular   Manufacturer: [place]   Lot: [id number]   [place]: [id number]

## 2020-03-30 ENCOUNTER — Other Ambulatory Visit: Payer: Medicare HMO

## 2020-04-04 ENCOUNTER — Encounter: Payer: Self-pay | Admitting: Thoracic Surgery (Cardiothoracic Vascular Surgery)

## 2020-04-04 ENCOUNTER — Ambulatory Visit
Admission: RE | Admit: 2020-04-04 | Discharge: 2020-04-04 | Disposition: A | Discharge status: Home / Self Care | Payer: Medicare HMO | Source: Ambulatory Visit | Attending: Thoracic Surgery (Cardiothoracic Vascular Surgery) | Admitting: Thoracic Surgery (Cardiothoracic Vascular Surgery)

## 2020-04-04 ENCOUNTER — Ambulatory Visit (INDEPENDENT_AMBULATORY_CARE_PROVIDER_SITE_OTHER): Payer: Medicare HMO | Admitting: Thoracic Surgery (Cardiothoracic Vascular Surgery)

## 2020-04-04 ENCOUNTER — Other Ambulatory Visit: Payer: Self-pay

## 2020-04-04 VITALS — BP 165/94 | HR 71 | Temp 97.7°F | Resp 16 | Ht 67.0 in | Wt 139.8 lb

## 2020-04-04 DIAGNOSIS — I712 Thoracic aortic aneurysm, without rupture, unspecified: Secondary | ICD-10-CM

## 2020-04-04 MED ORDER — IOPAMIDOL (ISOVUE-370) INJECTION 76%
75.0000 mL | Freq: Once | INTRAVENOUS | Status: AC | PRN
  Administered 2020-04-04: 12:00:00 75 mL via INTRAVENOUS

## 2020-04-04 NOTE — Progress Notes (Signed)
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  He presented last March with hemoptysis.  His CT showed no PE or pneumonia but he did have a 4.0 cm ascending aneurysm.  He has blood pressure cuff at home but has not been using it.  He is not having any chest pain, pressure, or tightness.  He is still smoking but says he is down to 2 cigarettes a day.  Past Medical History:  Diagnosis Date  . Anemia   . Asthma    AS CHILD, NO PROBLEM SINCE  . 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 GERD (gastroesophageal reflux disease)   . Hx of radiation therapy 11/15/10 -01/04/11   right false vocal cord  . Hypertension   . Recurrent upper respiratory infection (URI)    COLD,  COUGH     Current Outpatient Medications  Medication Sig Dispense Refill  . lansoprazole (PREVACID) 15 MG capsule Take 15 mg by mouth daily.    . metoprolol succinate (TOPROL XL) 25 MG 24 hr tablet Take 1 tablet (25 mg total) by mouth daily. 90 tablet 3   No current facility-administered medications for this visit.    Physical Exam BP (!) 165/94 (BP Location: Left Arm, Patient Position: Sitting, Cuff Size: Normal)   Pulse 71   Temp 97.7 F (36.5 C)   Resp 16   Ht 5\' 7"  (1.702 m)   Wt 139 lb 12.8 oz (63.4 kg)   SpO2 99% Comment: RA  BMI 21.90 kg/m  Thin 64 year old male in no acute distress Alert and oriented x3 with no focal deficits No carotid bruits Cardiac regular rate and rhythm normal S1 and S2 with no rubs or murmurs Lungs clear with equal breath sounds bilaterally No peripheral edema  Diagnostic Tests: CT ANGIOGRAPHY CHEST WITH CONTRAST  TECHNIQUE: Multidetector CT imaging of the chest was performed using  the standard protocol during bolus administration of intravenous contrast. Multiplanar CT image reconstructions and MIPs were obtained to evaluate the vascular anatomy.  CONTRAST:  6mL ISOVUE-370 IOPAMIDOL (ISOVUE-370) INJECTION 76%  COMPARISON:  02/08/2019  FINDINGS: Cardiovascular: Heart size normal. No pericardial effusion. Satisfactory opacification of pulmonary arteries noted, and there is no evidence of pulmonary emboli. Good contrast opacification of the thoracic aorta.  Aortic Root:  --Valve: 2.3 cm  --Sinuses: 4.1 cm  --Sinotubular Junction: 3 cm  Limitations by motion: Moderate  Thoracic Aorta:  --Ascending Aorta: 4 cm (previously 4 cm)  --Aortic Arch: 3.2 cm  --Descending Aorta: 3.1 cm  Other:  Classic 3 vessel brachiocephalic arterial origin anatomy without proximal stenosis. Scattered partially calcified plaque in the descending thoracic segment and visualized proximal abdominal aorta.  Mediastinum/Nodes: No hilar or mediastinal adenopathy.  Lungs/Pleura: No pleural effusion. No pneumothorax. Lungs are clear.  Upper Abdomen: Partially calcified stones measuring up to 1.2 cm in the nondilated gallbladder. Left upper quadrant abdominal wall tract from previous gastrostomy catheter. No acute findings.  Musculoskeletal: No chest wall abnormality. No acute or significant osseous findings.  Review of the MIP images confirms the above findings.  IMPRESSION: 1. Stable 4 cm ascending aortic  aneurysm. 2. Cholelithiasis.  Aortic Atherosclerosis (ICD10-I70.0). Aortic aneurysm NOS (ICD10-I71.9).   Electronically Signed   By: Lucrezia Europe M.D.   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  He was incidentally  noted to have a 4 cm aneurysm on the CT scan a year ago.  Thoracic aortic atherosclerosis/ascending aneurysm-aneurysm stable at 4 cm.  Needs continued annual follow-up.  Hypertension-he says that he has been taking his Toprol.  His blood pressure is elevated on exam today.  Some of that may be whitecoat hypertension.  He has a cuff at home but has not been using it.  I recommended that he use that on a regular basis if he is persistently above 140 he needs additional medications.  Tobacco abuse-importance of complete cessation was emphasized.  Plan: Return in 1 year with CT chest  Melrose Nakayama, MD [facility] [phone]

## 2021-01-23 DIAGNOSIS — Z923 Personal history of irradiation: Secondary | ICD-10-CM | POA: Diagnosis not present

## 2021-01-23 DIAGNOSIS — J302 Other seasonal allergic rhinitis: Secondary | ICD-10-CM | POA: Diagnosis not present

## 2021-01-23 DIAGNOSIS — Z8589 Personal history of malignant neoplasm of other organs and systems: Secondary | ICD-10-CM | POA: Diagnosis not present

## 2021-01-23 DIAGNOSIS — R69 Illness, unspecified: Secondary | ICD-10-CM | POA: Diagnosis not present

## 2021-01-23 DIAGNOSIS — Z79899 Other long term (current) drug therapy: Secondary | ICD-10-CM | POA: Diagnosis not present

## 2021-01-23 DIAGNOSIS — C01 Malignant neoplasm of base of tongue: Secondary | ICD-10-CM | POA: Diagnosis not present

## 2021-02-21 ENCOUNTER — Other Ambulatory Visit: Payer: Self-pay | Admitting: *Deleted

## 2021-02-21 DIAGNOSIS — I712 Thoracic aortic aneurysm, without rupture, unspecified: Secondary | ICD-10-CM

## 2021-04-24 ENCOUNTER — Ambulatory Visit (INDEPENDENT_AMBULATORY_CARE_PROVIDER_SITE_OTHER): Payer: Medicare Other | Admitting: Thoracic Surgery (Cardiothoracic Vascular Surgery)

## 2021-04-24 ENCOUNTER — Other Ambulatory Visit: Payer: Self-pay

## 2021-04-24 ENCOUNTER — Encounter: Payer: Self-pay | Admitting: Thoracic Surgery (Cardiothoracic Vascular Surgery)

## 2021-04-24 ENCOUNTER — Ambulatory Visit
Admission: RE | Admit: 2021-04-24 | Discharge: 2021-04-24 | Disposition: A | Discharge status: Home / Self Care | Payer: Medicare Other | Source: Ambulatory Visit | Attending: Thoracic Surgery (Cardiothoracic Vascular Surgery) | Admitting: Thoracic Surgery (Cardiothoracic Vascular Surgery)

## 2021-04-24 DIAGNOSIS — I712 Thoracic aortic aneurysm, without rupture, unspecified: Secondary | ICD-10-CM | POA: Insufficient documentation

## 2021-04-24 MED ORDER — IOPAMIDOL (ISOVUE-370) INJECTION 76%
75.0000 mL | Freq: Once | INTRAVENOUS | Status: AC | PRN
  Administered 2021-04-24: 75 mL via INTRAVENOUS

## 2021-04-24 MED ORDER — METOPROLOL SUCCINATE ER 50 MG PO TB24: 50.0000 mg | ORAL_TABLET | Freq: Every day | ORAL | 3 refills | Status: DC

## 2021-04-24 NOTE — Progress Notes (Signed)
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  He presented in March 2021 with hemoptysis.  A CT of the chest showed no evidence of pneumonia, mass, or PE.  He was noted to have a 4 cm ascending aneurysm.  Over the past year he continues to smoke.  He says he just has one cigarette here and there.  He does not check his blood pressure regularly at home.  He is not having any chest pain, pressure, tightness, or shortness of breath.  Past Medical History:  Diagnosis Date  . Anemia   . Asthma    AS CHILD, NO PROBLEM SINCE  . 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 GERD (gastroesophageal reflux disease)   . Hx of radiation therapy 11/15/10 -01/04/11   right false vocal cord  . Hypertension   . Recurrent upper respiratory infection (URI)    COLD,  COUGH     Current Outpatient Medications  Medication Sig Dispense Refill  . lansoprazole (PREVACID) 15 MG capsule Take 15 mg by mouth daily.    Marland Kitchen levothyroxine (SYNTHROID) 75 MCG tablet Take 75 mcg by mouth daily before breakfast.    . metoprolol succinate (TOPROL XL) 50 MG 24 hr tablet Take 1 tablet (50 mg total) by mouth daily. Take with or immediately following a meal. 90 tablet 3  . rosuvastatin (CRESTOR) 20 MG tablet Take 20 mg by mouth daily.     No current facility-administered medications for this visit.    Physical Exam BP (!) 175/97 (BP Location: Right Arm, Patient Position: Sitting)   Pulse 76   Resp 20   Ht 5\' 7"  (1.702 m)   Wt 155 lb (70.3 kg)   SpO2 98% Comment: RA  BMI 24.28 kg/m   Diagnostic Tests: CT ANGIOGRAPHY CHEST WITH CONTRAST  TECHNIQUE: Multidetector CT imaging of the chest was performed using the standard protocol  during bolus administration of intravenous contrast. Multiplanar CT image reconstructions and MIPs were obtained to evaluate the vascular anatomy.  CONTRAST:  39mL ISOVUE-370 IOPAMIDOL (ISOVUE-370) INJECTION 76%  COMPARISON:  CT angiogram chest April 04, 2020. PET-CT examination February 26, 2019  FINDINGS: Cardiovascular: Ascending thoracic aortic diameter measures 4.0 x 4.0 cm, stable. Measured diameter at the aortic arch  measures 3.2 cm, stable. Measured diameter at the sinuses of Valsalva measures 3.9 cm. Measured diameter of the descending aorta measures 2.9 x 2.8 cm. No evident thoracic aortic dissection. Visualized great vessels appear unremarkable. No evident pulmonary embolus. No pericardial effusion or pericardial thickening. There is atherosclerosis in the aorta with scattered areas of peripheral calcification. No evident hemodynamically significant obstruction or evident ulceration.  Mediastinum/Nodes: No evident thyroid lesion. No evident adenopathy. No appreciable esophageal lesions.  Lungs/Pleura: No edema or airspace opacity. Scattered areas of slight atelectasis. No evident pleural effusions. Trachea and major bronchial structures appear patent. No pneumothorax.  Upper Abdomen: There is cholelithiasis. Scarring along the anterior left upper quadrant wall from previous gastrostomy catheter, stable.  Musculoskeletal: Sclerotic focus incompletely visualized in the leftward aspect of the L3 vertebral body, present on prior PET study from March 2020. No other sclerotic lesions.  No lytic or destructive bone lesions. No evident chest wall lesions.  Review of the MIP images confirms the above findings.  IMPRESSION: 1. Stable ascending thoracic aortic prominence measuring 4.0 x 4.0 cm. Other measurements as noted. No dissection evident. Recommend annual imaging followup by CTA or MRA. This recommendation follows 2010  ACCF/AHA/AATS/ACR/ASA/SCA/SCAI/SIR/STS/SVM Guidelines for the Diagnosis and Management of Patients with Thoracic Aortic Disease. Circulation. 2010; 121: E092-Z300. Aortic aneurysm NOS (ICD10-I71.9). There are foci of aortic atherosclerosis.  2.  No evident pulmonary embolus.  3.  Areas of atelectatic change.  No edema or airspace opacity.  4.  Cholelithiasis.  4.  No evident adenopathy.  Aortic Atherosclerosis (ICD10-I70.0).   Electronically Signed   By: Lowella Grip III M.D.   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  Ascending aneurysm/thoracic aortic atherosclerosis-stable at 4 cm.  Needs continued annual follow-up.  Importance of blood pressure control was emphasized.  Hypertension-blood pressure poorly controlled on Toprol 25 mg daily.  He is not checking it at home.  Recommended he increase the Toprol to 50 mg daily.  Advised him to purchase a blood pressure cuff and check himself on a regular basis at home.  Tobacco abuse-he says he only smokes occasionally.  I recommended that he stop altogether.  Plan: Increase Toprol-XL to 50 mg daily Monitor blood pressure at home Return in 1 year with CT angio of chest  I spent over 20 minutes in review of records, images, and in consultation with Eric Dorsey today. Melrose Nakayama, MD [facility] [phone]

## 2021-08-05 ENCOUNTER — Encounter: Payer: Self-pay | Admitting: Internal Medicine

## 2021-08-05 ENCOUNTER — Encounter: Payer: Self-pay | Admitting: Gastroenterology

## 2021-10-29 ENCOUNTER — Encounter: Payer: Self-pay | Admitting: Internal Medicine

## 2021-11-30 ENCOUNTER — Telehealth: Payer: Self-pay | Admitting: *Deleted

## 2021-11-30 NOTE — Telephone Encounter (Signed)
Dr.Pyrtle,  Patient is a difficult intubation. Okay for direct hospital colonoscopy or OV? Please advise. Thank you, Joylyn Duggin pv

## 2021-11-30 NOTE — Telephone Encounter (Signed)
John Nulty,CRNA,   Please review. Okay for [place]? Please advise. Thank you, Emmalea Treanor pv

## 2021-12-03 NOTE — Telephone Encounter (Signed)
[place] for direct, nonurgent, hospital colonoscopy for polyp surveillance JMP

## 2021-12-05 ENCOUNTER — Other Ambulatory Visit: Payer: Self-pay

## 2021-12-05 DIAGNOSIS — Z86010 Personal history of colonic polyps: Secondary | ICD-10-CM

## 2021-12-05 NOTE — Telephone Encounter (Signed)
Pt scheduled for colon at [facility] 01/22/22 at 10:45am.

## 2021-12-20 ENCOUNTER — Ambulatory Visit (AMBULATORY_SURGERY_CENTER): Payer: Medicare Other | Admitting: *Deleted

## 2021-12-20 ENCOUNTER — Other Ambulatory Visit: Payer: Self-pay

## 2021-12-20 VITALS — Ht 67.0 in | Wt 155.0 lb

## 2021-12-20 DIAGNOSIS — Z86010 Personal history of colonic polyps: Secondary | ICD-10-CM

## 2021-12-20 MED ORDER — NA SULFATE-K SULFATE-MG SULF 17.5-3.13-1.6 GM/177ML PO SOLN: 1.0000 | Freq: Once | ORAL | 0 refills | Status: AC

## 2021-12-20 NOTE — Progress Notes (Signed)
PV completed over the phone. Pt verified name, DOB, address and insurance during PV today.  Pt mailed instruction packet with copy of consent form to read and not return, and instructions.   Pt encouraged to call with questions or issues.  If pt has My chart, procedure instructions sent via My Chart   No egg or soy allergy known to patient  No issues known to pt with past sedation with any surgeries or procedures Patient has difficulty with intubation 2020 [facility] case  No FH of Malignant Hyperthermia Pt is not on diet pills Pt is not on  home 02  Pt is not on blood thinners  Pt denies issues with constipation  No A fib or A flutter  Pt is fully vaccinated  for Covid   Due to the COVID-19 pandemic we are asking patients to follow certain guidelines in PV and the [place]   Pt aware of COVID protocols and [facility] guidelines

## 2022-01-03 ENCOUNTER — Encounter: Payer: Medicare Other | Admitting: Internal Medicine

## 2022-01-22 ENCOUNTER — Ambulatory Visit (HOSPITAL_COMMUNITY)
Admission: RE | Admit: 2022-01-22 | Discharge: 2022-01-22 | Disposition: A | Discharge status: Home / Self Care | Payer: Medicare Other | Attending: Internal Medicine | Admitting: Internal Medicine

## 2022-01-22 ENCOUNTER — Ambulatory Visit (HOSPITAL_COMMUNITY): Payer: Medicare Other | Admitting: Certified Registered"

## 2022-01-22 ENCOUNTER — Encounter (HOSPITAL_COMMUNITY)
Admission: RE | Disposition: A | Discharge status: Home / Self Care | Payer: Self-pay | Source: Home / Self Care | Attending: Internal Medicine

## 2022-01-22 ENCOUNTER — Other Ambulatory Visit: Payer: Self-pay

## 2022-01-22 ENCOUNTER — Encounter (HOSPITAL_COMMUNITY): Payer: Self-pay | Admitting: Internal Medicine

## 2022-01-22 DIAGNOSIS — K5520 Angiodysplasia of colon without hemorrhage: Secondary | ICD-10-CM | POA: Diagnosis not present

## 2022-01-22 DIAGNOSIS — D509 Iron deficiency anemia, unspecified: Secondary | ICD-10-CM | POA: Insufficient documentation

## 2022-01-22 DIAGNOSIS — Z1211 Encounter for screening for malignant neoplasm of colon: Secondary | ICD-10-CM | POA: Diagnosis not present

## 2022-01-22 DIAGNOSIS — K648 Other hemorrhoids: Secondary | ICD-10-CM | POA: Insufficient documentation

## 2022-01-22 DIAGNOSIS — Z86010 Personal history of colonic polyps: Secondary | ICD-10-CM | POA: Insufficient documentation

## 2022-01-22 DIAGNOSIS — K5730 Diverticulosis of large intestine without perforation or abscess without bleeding: Secondary | ICD-10-CM | POA: Insufficient documentation

## 2022-01-22 HISTORY — PX: HOT HEMOSTASIS: SHX5433

## 2022-01-22 HISTORY — PX: COLONOSCOPY WITH PROPOFOL: SHX5780

## 2022-01-22 SURGERY — COLONOSCOPY WITH PROPOFOL
Anesthesia: Monitor Anesthesia Care

## 2022-01-22 MED ORDER — PROPOFOL 1000 MG/100ML IV EMUL
INTRAVENOUS | Status: AC
  Filled 2022-01-22: qty 100

## 2022-01-22 MED ORDER — SODIUM CHLORIDE 0.9 % IV SOLN: INTRAVENOUS | Status: DC

## 2022-01-22 MED ORDER — PROPOFOL 500 MG/50ML IV EMUL
INTRAVENOUS | Status: DC | PRN
  Administered 2022-01-22: 125 ug/kg/min via INTRAVENOUS

## 2022-01-22 MED ORDER — PROPOFOL 500 MG/50ML IV EMUL
INTRAVENOUS | Status: AC
  Filled 2022-01-22: qty 50

## 2022-01-22 MED ORDER — LACTATED RINGERS IV SOLN: INTRAVENOUS | Status: DC | PRN

## 2022-01-22 MED ORDER — PROPOFOL 10 MG/ML IV BOLUS
INTRAVENOUS | Status: DC | PRN
Start: 2022-01-22 — End: 2022-01-22
  Administered 2022-01-22: 20 mg via INTRAVENOUS
  Administered 2022-01-22: 10 mg via INTRAVENOUS
  Administered 2022-01-22: 20 mg via INTRAVENOUS
  Administered 2022-01-22: 10 mg via INTRAVENOUS

## 2022-01-22 SURGICAL SUPPLY — 22 items
ELECT REM PT RETURN 9FT ADLT (ELECTROSURGICAL)
ELECTRODE REM PT RTRN 9FT ADLT (ELECTROSURGICAL) IMPLANT
FCP BXJMBJMB 240X2.8X (CUTTING FORCEPS)
FLOOR PAD 36X40 (MISCELLANEOUS) ×2
FORCEPS BIOP RAD 4 LRG CAP 4 (CUTTING FORCEPS) IMPLANT
FORCEPS BIOP RJ4 240 W/NDL (CUTTING FORCEPS)
FORCEPS BXJMBJMB 240X2.8X (CUTTING FORCEPS) IMPLANT
INJECTOR/SNARE I SNARE (MISCELLANEOUS) IMPLANT
LUBRICANT JELLY 4.5OZ STERILE (MISCELLANEOUS) IMPLANT
MANIFOLD NEPTUNE II (INSTRUMENTS) IMPLANT
NDL SCLEROTHERAPY 25GX240 (NEEDLE) IMPLANT
NEEDLE SCLEROTHERAPY 25GX240 (NEEDLE) IMPLANT
PAD FLOOR 36X40 (MISCELLANEOUS) ×2 IMPLANT
PROBE APC STR FIRE (PROBE) IMPLANT
PROBE INJECTION GOLD (MISCELLANEOUS)
PROBE INJECTION GOLD 7FR (MISCELLANEOUS) IMPLANT
SNARE ROTATE MED OVAL 20MM (MISCELLANEOUS) IMPLANT
SYR 50ML LL SCALE MARK (SYRINGE) IMPLANT
TRAP SPECIMEN MUCOUS 40CC (MISCELLANEOUS) IMPLANT
TUBING ENDO SMARTCAP PENTAX (MISCELLANEOUS) IMPLANT
TUBING IRRIGATION ENDOGATOR (MISCELLANEOUS) ×3 IMPLANT
WATER STERILE IRR 1000ML POUR (IV SOLUTION) IMPLANT

## 2022-01-22 NOTE — Transfer of Care (Signed)
Immediate Anesthesia Transfer of Care Note  Patient: Eric Dorsey  Procedure(s) Performed: COLONOSCOPY WITH PROPOFOL HOT HEMOSTASIS (ARGON PLASMA COAGULATION/BICAP)  Patient Location: PACU  Anesthesia Type:MAC  Level of Consciousness: drowsy  Airway & Oxygen Therapy: Patient Spontanous Breathing and Patient connected to face mask oxygen  Post-op Assessment: Report given to RN, Post -op Vital signs reviewed and stable and Patient moving all extremities X 4  Post vital signs: Reviewed and stable  Last Vitals:  Vitals Value Taken Time  BP 126/67   Temp    Pulse 74 01/22/22 1037  Resp    SpO2 100 % 01/22/22 1037  Vitals shown include unvalidated device data.  Last Pain:  Vitals:   01/22/22 0954  TempSrc: Temporal  PainSc: 0-No pain         Complications: No notable events documented.

## 2022-01-22 NOTE — Anesthesia Procedure Notes (Signed)
Procedure Name: MAC Date/Time: 01/22/2022 10:09 AM Performed by: Niel Hummer, CRNA Pre-anesthesia Checklist: Patient identified, Emergency Drugs available, Suction available and Patient being monitored Oxygen Delivery Method: Simple face mask

## 2022-01-22 NOTE — Discharge Instructions (Signed)
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
## 2022-01-22 NOTE — Anesthesia Postprocedure Evaluation (Signed)
Anesthesia Post Note  Patient: DAYMEIN NUNNERY  Procedure(s) Performed: COLONOSCOPY WITH PROPOFOL HOT HEMOSTASIS (ARGON PLASMA COAGULATION/BICAP)     Patient location during evaluation: PACU Anesthesia Type: MAC Level of consciousness: awake and alert Pain management: pain level controlled Vital Signs Assessment: post-procedure vital signs reviewed and stable Respiratory status: spontaneous breathing, nonlabored ventilation, respiratory function stable and patient connected to nasal cannula oxygen Cardiovascular status: stable and blood pressure returned to baseline Postop Assessment: no apparent nausea or vomiting Anesthetic complications: no   No notable events documented.  Last Vitals:  Vitals:   01/22/22 1053 01/22/22 1100  BP: (!) 121/97 (!) 157/97  Pulse: 76 73  Resp: (!) 23 (!) 23  Temp:    SpO2: 100% 100%    Last Pain:  Vitals:   01/22/22 1100  TempSrc:   PainSc: 0-No pain                 Tiajuana Amass

## 2022-01-22 NOTE — Anesthesia Preprocedure Evaluation (Signed)
Anesthesia Evaluation  Patient identified by MRN, date of birth, ID band Patient awake    Reviewed: Allergy & Precautions, NPO status , Patient's Chart, lab work & pertinent test results  Airway Mallampati: II  TM Distance: >3 FB Neck ROM: Full    Dental   Pulmonary asthma , Recent URI , Current Smoker and Patient abstained from smoking.,    breath sounds clear to auscultation       Cardiovascular hypertension, Pt. on medications and Pt. on home beta blockers  Rhythm:Regular Rate:Normal     Neuro/Psych negative neurological ROS     GI/Hepatic Neg liver ROS, GERD  ,  Endo/Other  Hypothyroidism   Renal/GU negative Renal ROS     Musculoskeletal   Abdominal   Peds  Hematology negative hematology ROS (+)   Anesthesia Other Findings   Reproductive/Obstetrics                             Anesthesia Physical Anesthesia Plan  ASA: 3  Anesthesia Plan: MAC   Post-op Pain Management: Minimal or no pain anticipated   Induction:   PONV Risk Score and Plan: 0 and Propofol infusion  Airway Management Planned: Natural Airway and Simple Face Mask  Additional Equipment:   Intra-op Plan:   Post-operative Plan:   Informed Consent: I have reviewed the patients History and Physical, chart, labs and discussed the procedure including the risks, benefits and alternatives for the proposed anesthesia with the patient or authorized representative who has indicated his/her understanding and acceptance.       Plan Discussed with:   Anesthesia Plan Comments:         Anesthesia Quick Evaluation

## 2022-01-22 NOTE — Op Note (Signed)
[facility] Patient Name: Eric Dorsey Procedure Date: 01/22/2022 MRN: [id number] Attending MD: Jerene Bears , MD Date of Birth: May 13, 1956 CSN: [id number] Age: 66 Admit Type: Outpatient Procedure:                Colonoscopy Indications:              High risk colon cancer surveillance: Personal                            history of multiple adenomas, Last colonoscopy:                            October 2019; also personal history of IDA related                            to colonic angioectasias treated with APC in 2019 Providers:                Lajuan Lines. Hilarie Fredrickson, MD, Doristine Johns, RN, Cherylynn Ridges, Technician, Maudry Diego, CRNA Referring MD:             Cyndi Bender Medicines:                Monitored Anesthesia Care Complications:            No immediate complications. Estimated Blood Loss:     Estimated blood loss: none. Procedure:                Pre-Anesthesia Assessment:                           - Prior to the procedure, a History and Physical                            was performed, and patient medications and                            allergies were reviewed. The patient's tolerance of                            previous anesthesia was also reviewed. The risks                            and benefits of the procedure and the sedation                            options and risks were discussed with the patient.                            All questions were answered, and informed consent                            was obtained. Prior Anticoagulants: The patient has  taken no previous anticoagulant or antiplatelet                            agents. ASA Grade Assessment: III - A patient with                            severe systemic disease. After reviewing the risks                            and benefits, the patient was deemed in                            satisfactory condition to undergo the  procedure.                           After obtaining informed consent, the colonoscope                            was passed under direct vision. Throughout the                            procedure, the patient's blood pressure, pulse, and                            oxygen saturations were monitored continuously. The                            [id number]-HQ190L ([id number]) Olympus colonoscope was                            introduced through the anus and advanced to the                            cecum, identified by appendiceal orifice and                            ileocecal valve. The colonoscopy was performed                            without difficulty. The patient tolerated the                            procedure well. The quality of the bowel                            preparation was good. The ileocecal valve,                            appendiceal orifice, and rectum were photographed. Scope In: 10:15:58 AM Scope Out: 10:34:17 AM Scope Withdrawal Time: 0 hours 14 minutes 53 seconds  Total Procedure Duration: 0 hours 18 minutes 19 seconds  Findings:      The digital rectal exam was normal.      Two small angioectasias with typical arborization were found in the  ascending colon and in the cecum. Fulguration to ablate the lesion to       prevent bleeding by argon plasma at 0.5 liters/minute and 20 watts was       successful.      Multiple small and large-mouthed diverticula were found in the sigmoid       colon, descending colon and transverse colon.      Internal hemorrhoids were found during retroflexion. The hemorrhoids       were medium-sized. Impression:               - Two colonic angioectasias. Treated with argon                            plasma coagulation (APC).                           - Diverticulosis in the sigmoid colon, in the                            descending colon and in the transverse colon.                           - Internal hemorrhoids.                            - No specimens collected. Moderate Sedation:      N/A Recommendation:           - Patient has a contact number available for                            emergencies. The signs and symptoms of potential                            delayed complications were discussed with the                            patient. Return to normal activities tomorrow.                            Written discharge instructions were provided to the                            patient.                           - Resume previous diet.                           - Continue present medications.                           - CBC and iron studies recommended routinely with                            primary care given history of IDA. Replace iron as  needed.                           - Repeat colonoscopy in 7 years for surveillance                            purposes. Procedure Code(s):        --- Professional ---                           [phone], Colonoscopy, flexible; with control of                            bleeding, any method Diagnosis Code(s):        --- Professional ---                           Z86.010, Personal history of colonic polyps                           K55.20, Angiodysplasia of colon without hemorrhage                           K64.8, Other hemorrhoids                           K57.30, Diverticulosis of large intestine without                            perforation or abscess without bleeding CPT copyright 2019 [employer] Association. All rights reserved. The codes documented in this report are preliminary and upon coder review may  be revised to meet current compliance requirements. Jerene Bears, MD 01/22/2022 10:42:05 AM This report has been signed electronically. Number of Addenda: 0

## 2022-01-23 ENCOUNTER — Encounter (HOSPITAL_COMMUNITY): Payer: Self-pay | Admitting: Internal Medicine

## 2022-03-21 ENCOUNTER — Other Ambulatory Visit: Payer: Self-pay | Admitting: Thoracic Surgery (Cardiothoracic Vascular Surgery)

## 2022-03-21 DIAGNOSIS — I712 Thoracic aortic aneurysm, without rupture, unspecified: Secondary | ICD-10-CM

## 2022-04-29 ENCOUNTER — Ambulatory Visit: Payer: Medicare Other | Attending: Otolaryngology | Admitting: Speech Pathology

## 2022-04-29 ENCOUNTER — Encounter: Payer: Self-pay | Admitting: Speech Pathology

## 2022-04-29 ENCOUNTER — Other Ambulatory Visit: Payer: Self-pay

## 2022-04-29 DIAGNOSIS — R1312 Dysphagia, oropharyngeal phase: Secondary | ICD-10-CM | POA: Diagnosis present
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
## 2022-04-29 NOTE — Patient Instructions (Signed)
? ?  SWALLOWING EXERCISES ?Effortful Swallows ?- Squeeze hard with the muscles in your neck while you swallow your  ?saliva or a sip of water ?- Repeat 20 times, 2-3 times a day, and use whenever you eat or drink ? ?Masako Swallow - swallow with your tongue sticking out ?- Stick tongue out and gently bite tongue with your teeth ?- Swallow, while holding your tongue with your teeth ?- Repeat 20 times, 2-3 times a day ? ? ?Shaker Exercise - head lift ?- Lie flat on your back in your bed or on a couch without pillows ?- Raise your head and look at your feet  - KEEP YOUR SHOULDERS DOWN ?- HOLD FOR 45 to 60 SECONDS, then lower your head back down ?- Repeat 3 times, 2-3 times a day ? ? ?Mendelsohn Maneuver -  swallow as tight as you  for 5 seconds ?- Start to swallow, and keep your Adam?s apple up by squeezing tight with the muscles of the throat ?- Hold the squeeze for 5-7 seconds and then relax ?- Repeat 20 times, 2-3 times a day ? ? ?Tongue Press ?- Press your entire tongue as hard as you can against the roof of your mouth for 3-5 seconds ?- Repeat 20 times, 2-3 times a day ? ?      6. CTAR - Chin Tuck Against Resistance  ?            - Place towel, ball or pool noodle under your chin ?            - Hold for 60 seconds 2-3x  a day ?            - Pulse up and down 20x 2-3x a day ? ?     7. Hold your breath, swallow and cough - do not breathe in before you cough ?             - 20x twice a day ? ?Small bites and sips ? ?Dry hard swallow after each bite ? ?Alternate solids with a liquid sip and hard swallow to clear the residue out of your throat ? ?Eliminate distractions with meals ?             ? ?

## 2022-04-30 ENCOUNTER — Other Ambulatory Visit: Payer: Self-pay | Admitting: Thoracic Surgery (Cardiothoracic Vascular Surgery)

## 2022-04-30 ENCOUNTER — Ambulatory Visit (INDEPENDENT_AMBULATORY_CARE_PROVIDER_SITE_OTHER): Payer: Medicare Other | Admitting: Physician Assistant

## 2022-04-30 ENCOUNTER — Ambulatory Visit
Admission: RE | Admit: 2022-04-30 | Discharge: 2022-04-30 | Disposition: A | Discharge status: Home / Self Care | Payer: Medicare Other | Source: Ambulatory Visit | Attending: Thoracic Surgery (Cardiothoracic Vascular Surgery) | Admitting: Thoracic Surgery (Cardiothoracic Vascular Surgery)

## 2022-04-30 VITALS — BP 119/73 | HR 89 | Resp 20 | Ht 67.0 in | Wt 151.0 lb

## 2022-04-30 DIAGNOSIS — I712 Thoracic aortic aneurysm, without rupture, unspecified: Secondary | ICD-10-CM | POA: Diagnosis not present

## 2022-04-30 NOTE — Patient Instructions (Signed)
Continue careful blood pressure control with a goal of less than 140/90. ? ?Avoid tobacco products ? ?Avoid strenuous activity with lifting more than 50 pounds ? ?Follow-up in 1 year with CTA chest ?

## 2022-04-30 NOTE — Progress Notes (Signed)
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  After having an episode of hemoptysis, he had a CT scan of his chest that demonstrated a 4 cm ascending aortic aneurysm.  He was referred to [place] for evaluation and has been followed by Dr. Roxan Hockey since April 2021. ?At the last visit 1 year ago, the ascending aortic aneurysm was stable at 4.0 cm.  Eric Dorsey had a systolic blood pressure in the 170s and was advised to increase his Toprol-XL (he is currently taking 100 mg daily and has also been started on losartan 50 mg p.o. daily).  He was also advised to obtain a blood pressure cuff for monitoring at home and also advised to stop smoking. ?  ?Today, Eric Dorsey says he feels well and has no new concerns except for some fatigue.  He denies any chest pain or shortness of breath.  He continues to smoke but says "I have cut way back". ? ? ?Current Outpatient Medications  ?Medication Sig Dispense Refill  ? augmented betamethasone dipropionate (DIPROLENE-AF) 0.05 % ointment Apply 1 application topically 2 (two) times daily as needed (rash).    ? cetirizine (ZYRTEC) 10 MG tablet Take 10 mg by mouth daily as needed for allergies.    ? Ensure (ENSURE) Take 237 mLs by mouth daily.    ? Fluticasone Propionate (ALLERGY RELIEF NA) Place 1 spray into the nose daily as needed (allergies).    ? lansoprazole (PREVACID) 15 MG capsule Take 15 mg by mouth daily.    ? levothyroxine (SYNTHROID) 100 MCG tablet Take 100 mcg by mouth daily.    ? losartan (COZAAR) 50 MG tablet Take 50 mg by mouth daily.    ? metoprolol succinate (TOPROL-XL) 100 MG 24 hr tablet Take 100 mg by mouth daily. Take with or immediately following a meal.    ? Multiple Vitamin (MULTIVITAMIN WITH MINERALS) TABS tablet Take 1 tablet by mouth every other day.    ? rosuvastatin (CRESTOR) 20 MG tablet Take 20 mg by mouth daily.    ? ?No current facility-administered medications  for this visit.  ? ? ?Physical Exam: ?Vital signs ?BP 119/73 ?Pulse 89 ?Respirations 20 ?SPO2 95% on room air ? ?General: Well-developed thin 66 year old male in no acute distress. ?Heart: Regular rate and rhythm.  No murmur. ?Chest: Symmetrical, breath sounds clear to auscultation. ?Extremities: All warm and well-perfused, no peripheral edema. ? ?Diagnostic Tests: ?CLINICAL DATA:  Thoracic aortic prominence. 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  ?EXAM: ?CT ANGIOGRAPHY CHEST WITH CONTRAST ?  ?TECHNIQUE: ?Multidetector CT imaging of the chest was performed using the ?standard protocol during bolus administration of intravenous ?contrast. Multiplanar CT image reconstructions and MIPs were ?obtained to evaluate the vascular anatomy. ?  ?CONTRAST:  53m ISOVUE-370 IOPAMIDOL (ISOVUE-370) INJECTION 76% ?  ?COMPARISON:  CT angiogram chest April 04, 2020. PET-CT examination ?February 26, 2019 ?  ?FINDINGS: ?Cardiovascular: Ascending thoracic aortic diameter measures 4.0 x ?4.0 cm, stable. Measured diameter at the aortic arch ?  ?measures 3.2 cm, stable. Measured diameter at the sinuses of ?Valsalva measures 3.9 cm. Measured diameter of the descending aorta ?measures 2.9 x 2.8 cm. No evident thoracic aortic dissection. ?Visualized great vessels appear unremarkable. No evident pulmonary ?embolus. No pericardial effusion or pericardial thickening. There is ?atherosclerosis in the aorta with scattered areas of peripheral ?calcification. No evident hemodynamically significant obstruction or ?evident ulceration. ?  ?Mediastinum/Nodes: No evident thyroid lesion. No evident  adenopathy. ?No appreciable esophageal lesions. ?  ?Lungs/Pleura: No edema or airspace opacity. Scattered areas of ?slight atelectasis. No evident pleural effusions. Trachea and major ?bronchial structures appear patent. No pneumothorax. ?  ?Upper Abdomen: There is cholelithiasis. Scarring along the anterior ?left upper quadrant wall from previous gastrostomy  catheter, stable. ?  ?Musculoskeletal: Sclerotic focus incompletely visualized in the ?leftward aspect of the L3 vertebral body, present on prior PET study ?from March 2020. No other sclerotic lesions. No lytic or destructive ?bone lesions. No evident chest wall lesions. ?  ?Review of the MIP images confirms the above findings. ?  ?IMPRESSION: ?1. Stable ascending thoracic aortic prominence measuring 4.0 x 4.0 ?cm. Other measurements as noted. No dissection evident. Recommend ?annual imaging followup by CTA or MRA. This recommendation follows ?2010 ACCF/AHA/AATS/ACR/ASA/SCA/SCAI/SIR/STS/SVM Guidelines for the ?Diagnosis and Management of Patients with Thoracic Aortic Disease. ?Circulation. 2010; 121: F638-G665. Aortic aneurysm NOS ?(ICD10-I71.9). There are foci of aortic atherosclerosis. ?  ?2.  No evident pulmonary embolus. ?  ?3.  Areas of atelectatic change.  No edema or airspace opacity. ?  ?4.  Cholelithiasis. ?  ?4.  No evident adenopathy. ?  ?Aortic Atherosclerosis (ICD10-I70.0). ?  ?  ?Electronically Signed ?  By: Lowella Grip III M.D. ?  On: 04/24/2021 12:31 ? ?Impression / Plan: ?Stable 4.0 cm ascending aortic aneurysm in a 66 year old male with hypertension, history of tobacco use, and supraglottic cancer. ?Continue careful blood pressure control and monitoring.  Avoid tobacco products.  Follow-up in 1 year with CTA chest ? ?I spent over 20 minutes reviewing clinical data and meeting with Eric Dorsey face-to-face. ? ?Antony Odea, PA-C ?[facility] ?[phone] ? ?

## 2022-05-06 ENCOUNTER — Encounter: Payer: Self-pay | Admitting: Speech Pathology

## 2022-05-06 ENCOUNTER — Ambulatory Visit: Payer: Medicare Other | Admitting: Speech Pathology

## 2022-05-06 DIAGNOSIS — R1312 Dysphagia, oropharyngeal phase: Secondary | ICD-10-CM | POA: Diagnosis not present
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
## 2022-05-06 NOTE — Patient Instructions (Addendum)
   Remember, after radiation, your sensation in your throat is lessened. You may not feel the residue or feel food go down the wrong pipe - this is why you need to follow the swallow precautions  Hard swallow with each bite Alternate solids with a liquid sip Don't talk until you have completed this cycle of solid then liquid sip  Do not use water with the exercises except effortful swallow. If you need to, take a sip, do a normal swallow them resume exercises  Signs of Aspiration Pneumonia   Chest pain/tightness Fever (can be low grade) Cough  With foul-smelling phlegm (sputum) With sputum containing pus or blood With greenish sputum Fatigue  Shortness of breath  Wheezing   **IF YOU HAVE THESE SIGNS, CONTACT YOUR DOCTOR OR GO TO THE EMERGENCY DEPARTMENT OR URGENT CARE AS SOON AS POSSIBLE**    SWALLOWING EXERCISES Effortful Swallows - Squeeze hard with the muscles in your neck while you swallow your  saliva or a sip of water - Repeat 20 times, 2-3 times a day, and use whenever you eat or drink  Masako Swallow - swallow with your tongue sticking out - Stick tongue out and gently bite tongue with your teeth - Swallow, while holding your tongue with your teeth - Repeat 20 times, 2-3 times a day   Shaker Exercise - head lift - Lie flat on your back in your bed or on a couch without pillows - Raise your head and look at your feet  - KEEP YOUR SHOULDERS DOWN - HOLD FOR 45 to 60 SECONDS, then lower your head back down - Repeat 3 times, 2-3 times a day   [employer] -  swallow as tight as you  for 5 seconds - Start to swallow, and keep your Adam's apple up by squeezing tight with the muscles of the throat - Hold the squeeze for 5-7 seconds and then relax - Repeat 20 times, 2-3 times a day   Tongue Press - Press your entire tongue as hard as you can against the roof of your mouth for 3-5 seconds - Repeat 20 times, 2-3 times a day        6. CTAR - Chin Tuck Against  Resistance              - Place towel, ball or pool noodle under your chin             - Hold for 60 seconds 2-3x  a day             - Pulse up and down 20x 2-3x a day       7. Hold your breath, swallow and cough - do not breathe in before you cough              - 20x twice a day

## 2022-05-08 ENCOUNTER — Ambulatory Visit: Payer: Medicare Other | Admitting: Speech Pathology

## 2022-05-15 ENCOUNTER — Ambulatory Visit: Payer: Medicare Other | Admitting: Speech Pathology

## 2022-05-20 ENCOUNTER — Telehealth: Payer: Self-pay | Admitting: Speech Pathology

## 2022-05-20 ENCOUNTER — Ambulatory Visit: Payer: Medicare Other | Attending: Otolaryngology | Admitting: Speech Pathology

## 2022-05-20 DIAGNOSIS — R1312 Dysphagia, oropharyngeal phase: Secondary | ICD-10-CM | POA: Insufficient documentation

## 2022-05-20 NOTE — Telephone Encounter (Signed)
Left VM re: 2 no shows to ST appointments. Informed pt. of next appointment Wed 6/7 at 1:15. Instructed pt to call front office to confirm or cancel. If 1 more no show, will d/c ST.

## 2022-05-22 ENCOUNTER — Ambulatory Visit: Payer: Medicare Other | Admitting: Speech Pathology

## 2022-05-22 ENCOUNTER — Encounter: Payer: Self-pay | Admitting: Speech Pathology

## 2022-05-22 DIAGNOSIS — R1312 Dysphagia, oropharyngeal phase: Secondary | ICD-10-CM

## 2022-05-22 NOTE — Patient Instructions (Addendum)
Let your doctor know you had a swallow x ray and are getting swallowing therapy but are doing very well  Remember Therin, you have had radiation and some surgery so  your sensation in your throat is reduced. You are not feeling when some food is left in your throat after you swallow. The swallow x-ray at [facility] showed this  Keep up the exercises for 6 more weeks - twice a day   It is important that you alternate solid bites with liquid sips to wash down any food left in your throat  Bite, chew, swallow hard, sip, swallow hard  You are doing this to prevent aspiration pneumonia   Signs of Aspiration Pneumonia   Chest pain/tightness Fever (can be low grade) Cough  With foul-smelling phlegm (sputum) With sputum containing pus or blood With greenish sputum Fatigue  Shortness of breath  Wheezing   **IF YOU HAVE THESE SIGNS, CONTACT YOUR DOCTOR OR GO TO THE EMERGENCY DEPARTMENT OR URGENT CARE AS SOON AS POSSIBLE**
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
## 2022-05-27 ENCOUNTER — Ambulatory Visit: Payer: Medicare Other | Admitting: Speech Pathology

## 2022-05-29 ENCOUNTER — Ambulatory Visit: Payer: Medicare Other | Admitting: Speech Pathology

## 2022-06-03 ENCOUNTER — Encounter: Payer: Medicare Other | Admitting: Speech Pathology

## 2022-06-05 ENCOUNTER — Encounter: Payer: Medicare Other | Admitting: Speech Pathology

## 2022-06-10 ENCOUNTER — Encounter: Payer: Medicare Other | Admitting: Speech Pathology

## 2022-06-12 ENCOUNTER — Encounter: Payer: Medicare Other | Admitting: Speech Pathology

## 2022-08-16 DEATH — deceased
